# Patient Record
Sex: Female | Born: 1965 | Race: White | Hispanic: No | Marital: Married | Smoking: Never smoker
Health system: Southern US, Community
[De-identification: ages and names within clinical notes are randomized; demographics above are authoritative.]

## PROBLEM LIST (undated history)

## (undated) DIAGNOSIS — D649 Anemia, unspecified: Secondary | ICD-10-CM

## (undated) DIAGNOSIS — K5790 Diverticulosis of intestine, part unspecified, without perforation or abscess without bleeding: Secondary | ICD-10-CM

## (undated) DIAGNOSIS — K279 Peptic ulcer, site unspecified, unspecified as acute or chronic, without hemorrhage or perforation: Secondary | ICD-10-CM

## (undated) DIAGNOSIS — N809 Endometriosis, unspecified: Secondary | ICD-10-CM

## (undated) DIAGNOSIS — K219 Gastro-esophageal reflux disease without esophagitis: Secondary | ICD-10-CM

## (undated) DIAGNOSIS — T4145XA Adverse effect of unspecified anesthetic, initial encounter: Secondary | ICD-10-CM

## (undated) DIAGNOSIS — T884XXA Failed or difficult intubation, initial encounter: Secondary | ICD-10-CM

## (undated) DIAGNOSIS — M459 Ankylosing spondylitis of unspecified sites in spine: Secondary | ICD-10-CM

## (undated) DIAGNOSIS — R768 Other specified abnormal immunological findings in serum: Secondary | ICD-10-CM

## (undated) DIAGNOSIS — J189 Pneumonia, unspecified organism: Secondary | ICD-10-CM

## (undated) DIAGNOSIS — M51369 Other intervertebral disc degeneration, lumbar region without mention of lumbar back pain or lower extremity pain: Secondary | ICD-10-CM

## (undated) DIAGNOSIS — T8859XA Other complications of anesthesia, initial encounter: Secondary | ICD-10-CM

## (undated) DIAGNOSIS — F909 Attention-deficit hyperactivity disorder, unspecified type: Secondary | ICD-10-CM

## (undated) DIAGNOSIS — R1013 Epigastric pain: Secondary | ICD-10-CM

## (undated) DIAGNOSIS — M150 Primary generalized (osteo)arthritis: Secondary | ICD-10-CM

## (undated) HISTORY — PX: MICRODISCECTOMY LUMBAR: SUR864

## (undated) HISTORY — PX: DILATION AND CURETTAGE OF UTERUS: SHX78

## (undated) HISTORY — PX: ABDOMINAL HYSTERECTOMY: SHX81

## (undated) HISTORY — DX: Endometriosis, unspecified: N80.9

## (undated) HISTORY — PX: COLONOSCOPY: SHX174

## (undated) HISTORY — DX: Gastro-esophageal reflux disease without esophagitis: K21.9

## (undated) HISTORY — PX: BREAST BIOPSY: SHX20

## (undated) HISTORY — DX: Diverticulosis of intestine, part unspecified, without perforation or abscess without bleeding: K57.90

---

## 1990-02-01 DIAGNOSIS — T884XXA Failed or difficult intubation, initial encounter: Secondary | ICD-10-CM

## 1990-02-01 HISTORY — DX: Failed or difficult intubation, initial encounter: T88.4XXA

## 1997-02-01 HISTORY — PX: CHOLECYSTECTOMY: SHX55

## 2004-06-20 ENCOUNTER — Other Ambulatory Visit: Payer: Self-pay

## 2004-06-20 ENCOUNTER — Emergency Department: Payer: Self-pay | Admitting: Emergency Medicine

## 2004-06-25 ENCOUNTER — Ambulatory Visit: Payer: Self-pay

## 2004-12-17 ENCOUNTER — Ambulatory Visit: Payer: Self-pay

## 2005-06-10 ENCOUNTER — Ambulatory Visit: Payer: Self-pay | Admitting: Internal Medicine

## 2006-01-03 ENCOUNTER — Ambulatory Visit: Payer: Self-pay | Admitting: Pain Medicine

## 2006-02-08 ENCOUNTER — Ambulatory Visit: Payer: Self-pay | Admitting: Internal Medicine

## 2006-02-18 ENCOUNTER — Ambulatory Visit: Payer: Self-pay | Admitting: Pain Medicine

## 2006-02-22 ENCOUNTER — Ambulatory Visit: Payer: Self-pay

## 2006-03-07 ENCOUNTER — Ambulatory Visit: Payer: Self-pay | Admitting: Physician Assistant

## 2006-09-02 ENCOUNTER — Ambulatory Visit: Payer: Self-pay | Admitting: Internal Medicine

## 2006-10-26 ENCOUNTER — Ambulatory Visit: Payer: Self-pay | Admitting: Unknown Physician Specialty

## 2006-12-19 ENCOUNTER — Ambulatory Visit: Payer: Self-pay

## 2007-01-17 ENCOUNTER — Ambulatory Visit: Payer: Self-pay | Admitting: Unknown Physician Specialty

## 2007-01-18 ENCOUNTER — Ambulatory Visit: Payer: Self-pay | Admitting: Unknown Physician Specialty

## 2007-01-23 ENCOUNTER — Emergency Department: Payer: Self-pay | Admitting: Emergency Medicine

## 2007-01-30 ENCOUNTER — Inpatient Hospital Stay: Payer: Self-pay | Admitting: Unknown Physician Specialty

## 2007-02-02 HISTORY — PX: BACK SURGERY: SHX140

## 2007-02-24 ENCOUNTER — Ambulatory Visit: Payer: Self-pay | Admitting: Internal Medicine

## 2007-03-14 ENCOUNTER — Encounter: Payer: Self-pay | Admitting: Physician Assistant

## 2007-04-02 ENCOUNTER — Encounter: Payer: Self-pay | Admitting: Physician Assistant

## 2007-04-19 ENCOUNTER — Ambulatory Visit: Payer: Self-pay

## 2007-05-03 ENCOUNTER — Encounter: Payer: Self-pay | Admitting: Physician Assistant

## 2007-06-02 ENCOUNTER — Encounter: Payer: Self-pay | Admitting: Physician Assistant

## 2007-07-11 ENCOUNTER — Ambulatory Visit: Payer: Self-pay | Admitting: Internal Medicine

## 2007-08-28 ENCOUNTER — Ambulatory Visit: Payer: Self-pay

## 2007-08-29 ENCOUNTER — Ambulatory Visit: Payer: Self-pay

## 2008-02-02 HISTORY — PX: LAPAROSCOPIC SUPRACERVICAL HYSTERECTOMY: SUR797

## 2008-05-30 ENCOUNTER — Ambulatory Visit: Payer: Self-pay

## 2008-10-02 ENCOUNTER — Ambulatory Visit: Payer: Self-pay

## 2008-10-10 ENCOUNTER — Inpatient Hospital Stay: Payer: Self-pay

## 2009-05-13 ENCOUNTER — Ambulatory Visit: Payer: Self-pay | Admitting: Internal Medicine

## 2009-08-06 ENCOUNTER — Ambulatory Visit: Payer: Self-pay

## 2009-08-15 ENCOUNTER — Ambulatory Visit: Payer: Self-pay | Admitting: Psychiatry

## 2010-09-30 ENCOUNTER — Ambulatory Visit: Payer: Self-pay

## 2010-11-18 ENCOUNTER — Other Ambulatory Visit: Payer: Self-pay | Admitting: Psychiatry

## 2011-09-14 ENCOUNTER — Other Ambulatory Visit: Payer: Self-pay | Admitting: Psychiatry

## 2011-09-14 LAB — CBC WITH DIFFERENTIAL/PLATELET
Basophil #: 0 10*3/uL (ref 0.0–0.1)
Basophil %: 0.7 %
Eosinophil #: 0.1 10*3/uL (ref 0.0–0.7)
Lymphocyte #: 1.8 10*3/uL (ref 1.0–3.6)
Lymphocyte %: 34.9 %
MCV: 89 fL (ref 80–100)
Monocyte #: 0.3 x10 3/mm (ref 0.2–0.9)
Monocyte %: 6.3 %
Neutrophil #: 3 10*3/uL (ref 1.4–6.5)
Neutrophil %: 56.4 %
Platelet: 247 10*3/uL (ref 150–440)
RBC: 3.81 10*6/uL (ref 3.80–5.20)
RDW: 12.8 % (ref 11.5–14.5)
WBC: 5.3 10*3/uL (ref 3.6–11.0)

## 2011-09-14 LAB — POTASSIUM: Potassium: 4.4 mmol/L (ref 3.5–5.1)

## 2011-09-14 LAB — CREATININE, SERUM
EGFR (African American): >60
EGFR (Non-African Amer.): >60

## 2011-09-14 LAB — SGOT (AST)(ARMC): SGOT(AST): 14 U/L — ABNORMAL LOW (ref 15–37)

## 2011-09-14 LAB — GLUCOSE, RANDOM: Glucose: 110 mg/dL — ABNORMAL HIGH (ref 65–99)

## 2011-09-14 LAB — SODIUM: Sodium: 140 mmol/L (ref 136–145)

## 2011-09-14 LAB — IRON: Iron: 82 ug/dL (ref 50–170)

## 2011-09-14 LAB — ALT: SGPT (ALT): 14 U/L (ref 12–78)

## 2011-10-07 ENCOUNTER — Ambulatory Visit: Payer: Self-pay

## 2012-10-10 ENCOUNTER — Ambulatory Visit: Payer: Self-pay

## 2012-10-20 ENCOUNTER — Ambulatory Visit: Payer: Self-pay

## 2012-11-23 ENCOUNTER — Ambulatory Visit: Payer: Self-pay | Admitting: Physical Medicine and Rehabilitation

## 2013-10-16 ENCOUNTER — Ambulatory Visit: Payer: Self-pay | Admitting: Internal Medicine

## 2013-10-19 ENCOUNTER — Ambulatory Visit: Payer: Self-pay

## 2013-11-13 ENCOUNTER — Other Ambulatory Visit: Payer: Self-pay | Admitting: Surgery

## 2013-11-13 DIAGNOSIS — N6489 Other specified disorders of breast: Secondary | ICD-10-CM

## 2013-11-21 ENCOUNTER — Ambulatory Visit
Admission: RE | Admit: 2013-11-21 | Discharge: 2013-11-21 | Disposition: A | Discharge status: Home / Self Care | Payer: 59 | Source: Ambulatory Visit | Attending: Surgery | Admitting: Surgery

## 2013-11-21 DIAGNOSIS — N6489 Other specified disorders of breast: Secondary | ICD-10-CM

## 2013-11-21 MED ORDER — GADOBENATE DIMEGLUMINE 529 MG/ML IV SOLN
14.0000 mL | Freq: Once | INTRAVENOUS | Status: AC | PRN
  Administered 2013-11-21: 14 mL via INTRAVENOUS

## 2013-11-23 ENCOUNTER — Ambulatory Visit: Payer: Self-pay | Admitting: Unknown Physician Specialty

## 2014-03-19 ENCOUNTER — Emergency Department: Payer: Self-pay | Admitting: Internal Medicine

## 2014-05-27 LAB — SURGICAL PATHOLOGY

## 2014-11-05 ENCOUNTER — Other Ambulatory Visit: Payer: Self-pay | Admitting: Unknown Physician Specialty

## 2014-11-05 DIAGNOSIS — R922 Inconclusive mammogram: Secondary | ICD-10-CM

## 2014-11-13 ENCOUNTER — Ambulatory Visit: Payer: 59

## 2014-11-13 ENCOUNTER — Other Ambulatory Visit: Payer: 59

## 2014-11-14 ENCOUNTER — Ambulatory Visit
Admission: RE | Admit: 2014-11-14 | Discharge: 2014-11-14 | Disposition: A | Discharge status: Home / Self Care | Payer: 59 | Source: Ambulatory Visit | Attending: Unknown Physician Specialty | Admitting: Unknown Physician Specialty

## 2014-11-14 DIAGNOSIS — R922 Inconclusive mammogram: Secondary | ICD-10-CM

## 2015-05-09 DIAGNOSIS — D6489 Other specified anemias: Secondary | ICD-10-CM | POA: Diagnosis not present

## 2015-05-09 DIAGNOSIS — Z0000 Encounter for general adult medical examination without abnormal findings: Secondary | ICD-10-CM | POA: Diagnosis not present

## 2015-05-27 DIAGNOSIS — D6489 Other specified anemias: Secondary | ICD-10-CM | POA: Diagnosis not present

## 2015-05-27 DIAGNOSIS — J3089 Other allergic rhinitis: Secondary | ICD-10-CM | POA: Diagnosis not present

## 2015-05-27 DIAGNOSIS — R1013 Epigastric pain: Secondary | ICD-10-CM | POA: Diagnosis not present

## 2015-05-27 DIAGNOSIS — M5136 Other intervertebral disc degeneration, lumbar region: Secondary | ICD-10-CM | POA: Diagnosis not present

## 2015-06-03 DIAGNOSIS — R609 Edema, unspecified: Secondary | ICD-10-CM | POA: Diagnosis not present

## 2015-06-03 DIAGNOSIS — R079 Chest pain, unspecified: Secondary | ICD-10-CM | POA: Diagnosis not present

## 2015-06-26 DIAGNOSIS — M5136 Other intervertebral disc degeneration, lumbar region: Secondary | ICD-10-CM | POA: Diagnosis not present

## 2015-06-26 DIAGNOSIS — D6489 Other specified anemias: Secondary | ICD-10-CM | POA: Diagnosis not present

## 2015-06-26 DIAGNOSIS — J3089 Other allergic rhinitis: Secondary | ICD-10-CM | POA: Diagnosis not present

## 2015-06-26 DIAGNOSIS — F4321 Adjustment disorder with depressed mood: Secondary | ICD-10-CM | POA: Diagnosis not present

## 2015-07-14 ENCOUNTER — Encounter: Payer: Self-pay | Admitting: Emergency Medicine

## 2015-07-14 ENCOUNTER — Emergency Department: Payer: 59

## 2015-07-14 ENCOUNTER — Emergency Department
Admission: EM | Admit: 2015-07-14 | Discharge: 2015-07-14 | Disposition: A | Discharge status: Home / Self Care | Payer: 59 | Attending: Emergency Medicine | Admitting: Emergency Medicine

## 2015-07-14 DIAGNOSIS — R1031 Right lower quadrant pain: Secondary | ICD-10-CM | POA: Diagnosis present

## 2015-07-14 DIAGNOSIS — R109 Unspecified abdominal pain: Secondary | ICD-10-CM | POA: Diagnosis not present

## 2015-07-14 DIAGNOSIS — N390 Urinary tract infection, site not specified: Secondary | ICD-10-CM | POA: Diagnosis not present

## 2015-07-14 LAB — CBC
HEMATOCRIT: 35.3 % (ref 35.0–47.0)
HEMOGLOBIN: 11.9 g/dL — AB (ref 12.0–16.0)
MCH: 27.9 pg (ref 26.0–34.0)
MCHC: 33.7 g/dL (ref 32.0–36.0)
MCV: 82.7 fL (ref 80.0–100.0)
Platelets: 321 10*3/uL (ref 150–440)
RBC: 4.27 MIL/uL (ref 3.80–5.20)
RDW: 13.3 % (ref 11.5–14.5)
WBC: 8.3 10*3/uL (ref 3.6–11.0)

## 2015-07-14 LAB — BASIC METABOLIC PANEL
Anion gap: 11 (ref 5–15)
BUN: 7 mg/dL (ref 6–20)
CALCIUM: 9.4 mg/dL (ref 8.9–10.3)
CO2: 31 mmol/L (ref 22–32)
CREATININE: 0.68 mg/dL (ref 0.44–1.00)
Chloride: 89 mmol/L — ABNORMAL LOW (ref 101–111)
GFR calc non Af Amer: >60 mL/min (ref >60)
GLUCOSE: 100 mg/dL — AB (ref 65–99)
Potassium: 3.1 mmol/L — ABNORMAL LOW (ref 3.5–5.1)
Sodium: 131 mmol/L — ABNORMAL LOW (ref 135–145)

## 2015-07-14 LAB — URINALYSIS COMPLETE WITH MICROSCOPIC (ARMC ONLY)
BILIRUBIN URINE: NEGATIVE
Glucose, UA: NEGATIVE mg/dL
HGB URINE DIPSTICK: NEGATIVE
Ketones, ur: NEGATIVE mg/dL
NITRITE: NEGATIVE
PH: 7 (ref 5.0–8.0)
Protein, ur: NEGATIVE mg/dL
Specific Gravity, Urine: 1.005 (ref 1.005–1.030)

## 2015-07-14 LAB — LIPASE, BLOOD: LIPASE: 25 U/L (ref 11–51)

## 2015-07-14 LAB — COMPREHENSIVE METABOLIC PANEL
ALT: 14 U/L (ref 14–54)
ANION GAP: 11 (ref 5–15)
AST: 20 U/L (ref 15–41)
Albumin: 4.4 g/dL (ref 3.5–5.0)
Alkaline Phosphatase: 89 U/L (ref 38–126)
BILIRUBIN TOTAL: 0.8 mg/dL (ref 0.3–1.2)
BUN: 7 mg/dL (ref 6–20)
CO2: 28 mmol/L (ref 22–32)
Calcium: 9.3 mg/dL (ref 8.9–10.3)
Chloride: 90 mmol/L — ABNORMAL LOW (ref 101–111)
Creatinine, Ser: 0.64 mg/dL (ref 0.44–1.00)
Glucose, Bld: 95 mg/dL (ref 65–99)
POTASSIUM: 2.7 mmol/L — AB (ref 3.5–5.1)
Sodium: 129 mmol/L — ABNORMAL LOW (ref 135–145)
TOTAL PROTEIN: 8 g/dL (ref 6.5–8.1)

## 2015-07-14 MED ORDER — LEVOFLOXACIN 500 MG PO TABS: 500.0000 mg | ORAL_TABLET | Freq: Every day | ORAL | Status: AC

## 2015-07-14 MED ORDER — LEVOFLOXACIN 500 MG PO TABS
500.0000 mg | ORAL_TABLET | Freq: Once | ORAL | Status: AC
  Administered 2015-07-14: 500 mg via ORAL
  Filled 2015-07-14: qty 1

## 2015-07-14 MED ORDER — POTASSIUM CHLORIDE CRYS ER 20 MEQ PO TBCR: 20.0000 meq | EXTENDED_RELEASE_TABLET | Freq: Every day | ORAL | Status: DC

## 2015-07-14 MED ORDER — DOCUSATE SODIUM 100 MG PO CAPS: 100.0000 mg | ORAL_CAPSULE | Freq: Two times a day (BID) | ORAL | Status: DC

## 2015-07-14 MED ORDER — TRAMADOL HCL 50 MG PO TABS: 50.0000 mg | ORAL_TABLET | Freq: Four times a day (QID) | ORAL | Status: DC | PRN

## 2015-07-14 NOTE — ED Notes (Signed)
Pt wheeled from CT to room via wheelchair.

## 2015-07-14 NOTE — ED Notes (Signed)
Pt reports Friday upper abdomen pain, now lower R abdomen pain. Pt also reports back pain. Took Ducolax Friday and Miralax Saturday. Saturday had some diarrhea, Sunday passed small BM. No hx of kidney stones, does not have gallbladder.

## 2015-07-14 NOTE — ED Notes (Addendum)
C/o right flank, RLQ and back pain.  Pain started on Friday, but pain was in RUQ.  Patient states bowel movements have decreased as well.  Patient has taken miralax and dulcolax over the weekend.  Describes pain as lessening.

## 2015-07-14 NOTE — ED Provider Notes (Signed)
[facility] Emergency Department Provider Note  Time seen: 9:20 PM  I have reviewed the triage vital signs and the nursing notes.   HISTORY  Chief Complaint Flank Pain and Back Pain    HPI Christalyn Stantz is a 50 y.o. female no past medical history presents the emergency department with right flank pain. According to the patient since Friday she has been experiencing right-sided abdominal pain. States he was much worse on Friday, states she felt constipated took Dulcolax and MiraLAX and had some bowel movement Saturday and Sunday. States the pain continued today but not as severe. Patient states she is status post cholecystectomy, states no history of kidney stones. Denies any dysuria but has noticed decreased urination. Denies any fever. States nausea on Friday but denies any today.Currently describes the pain as a 3/10 dull pain in the right mid abdomen. Patient has not noted any modifying factors.     History reviewed. No pertinent past medical history.  There are no active problems to display for this patient.   History reviewed. No pertinent past surgical history.  No current outpatient prescriptions on file.  Allergies Norflex  Family History  Problem Relation Age of Onset  . Breast cancer Paternal Aunt 102    Social History Social History  Substance Use Topics  . Smoking status: Never Smoker   . Smokeless tobacco: None  . Alcohol Use: None    Review of Systems Constitutional: Negative for fever. Cardiovascular: Negative for chest pain. Respiratory: Negative for shortness of breath. Gastrointestinal: Right flank pain. Positive for nausea at times. Negative for vomiting or diarrhea. Positive for constipation. Genitourinary: Negative for dysuria. Decreased urine production. Musculoskeletal: Negative for back pain. Neurological: Negative for headache 10-point ROS otherwise  negative.  ____________________________________________   PHYSICAL EXAM:  VITAL SIGNS: ED Triage Vitals  Enc Vitals Group     BP 07/14/15 1925 122/69 mmHg     Pulse Rate 07/14/15 1925 84     Resp 07/14/15 1925 18     Temp 07/14/15 1925 98.1 F (36.7 C)     Temp Source 07/14/15 1925 Oral     SpO2 07/14/15 1925 98 %     Weight 07/14/15 1925 147 lb (66.679 kg)     Height 07/14/15 1925 5' (1.524 m)     Head Cir --      Peak Flow --      Pain Score 07/14/15 1926 4     Pain Loc --      Pain Edu? --      Excl. in [place]? --     Constitutional: Alert and oriented. Well appearing and in no distress. Eyes: Normal exam ENT   Head: Normocephalic and atraumatic   Mouth/Throat: Mucous membranes are moist. Cardiovascular: Normal rate, regular rhythm. No murmur Respiratory: Normal respiratory effort without tachypnea nor retractions. Breath sounds are clear  Gastrointestinal: Soft, mild right mid abdomen tenderness palpation. No rebound or guarding. No distention. No CVA tenderness. Musculoskeletal: Nontender with normal range of motion in all extremities. Neurologic:  Normal speech and language. No gross focal neurologic deficits  Skin:  Skin is warm, dry and intact.  Psychiatric: Mood and affect are normal.   ____________________________________________   RADIOLOGY  CT is largely normal. Pulmonary nodule, but patient is a very low risk.  ____________________________________________    INITIAL IMPRESSION / ASSESSMENT AND PLAN / ED COURSE  Pertinent labs & imaging results that were available during my care of the patient were reviewed by me  and considered in my medical decision making (see chart for details).  Overall the patient appears very well, mild right mid abdominal tenderness palpation. Patient's labs are significant for hypokalemia as well as hyponatremia. Urinalysis shows many bacteria. Could possibly be a sign of pyelonephritis. Given the patient's hypokalemia  with hyponatremia we'll recheck the patient's chemistry Marcello Moores he does not have a history of either. We'll obtain a CT renal scan to evaluate her right flank pain. Overall the patient appears well, states she does not wish for any pain medication currently a 3/10. States she had nausea several days ago but denies any currently, does not show any nausea medication.  Repeat chemistry shows hyponatremia but not to the same degree, 131 on repeat testing. Hypokalemia 3.1. I discussed this with the patient she states her doctor recently started her on a fluid pill, this would likely explain the hypokalemia. We'll place patient on 20 mg of Klor-Con daily, have her follow up with a primary care doctor in 3-4 days for repeat testing. I discussed strict return precautions for the patient, she is agreeable to this plan. We'll discharge with a course of Colace, Levaquin, and Ultram as needed for discomfort.  ____________________________________________   FINAL CLINICAL IMPRESSION(S) / ED DIAGNOSES  Right flank pain Urinary tract infection  Harvest Dark, MD 07/14/15 2228

## 2015-07-14 NOTE — ED Notes (Signed)
Pt taken to CT via wheelchair.

## 2015-07-14 NOTE — ED Notes (Signed)
Pt ambulatory to restroom and back with steady gait noted.

## 2015-07-14 NOTE — Discharge Instructions (Signed)

## 2015-07-22 DIAGNOSIS — E876 Hypokalemia: Secondary | ICD-10-CM | POA: Diagnosis not present

## 2015-07-22 DIAGNOSIS — R600 Localized edema: Secondary | ICD-10-CM | POA: Diagnosis not present

## 2015-07-22 DIAGNOSIS — E871 Hypo-osmolality and hyponatremia: Secondary | ICD-10-CM | POA: Diagnosis not present

## 2015-07-22 DIAGNOSIS — N390 Urinary tract infection, site not specified: Secondary | ICD-10-CM | POA: Diagnosis not present

## 2015-08-06 DIAGNOSIS — R1013 Epigastric pain: Secondary | ICD-10-CM | POA: Diagnosis not present

## 2015-08-06 DIAGNOSIS — R600 Localized edema: Secondary | ICD-10-CM | POA: Diagnosis not present

## 2015-08-06 DIAGNOSIS — M5136 Other intervertebral disc degeneration, lumbar region: Secondary | ICD-10-CM | POA: Diagnosis not present

## 2015-08-06 DIAGNOSIS — D6489 Other specified anemias: Secondary | ICD-10-CM | POA: Diagnosis not present

## 2015-08-09 ENCOUNTER — Telehealth: Payer: 59 | Admitting: Family

## 2015-08-09 DIAGNOSIS — J029 Acute pharyngitis, unspecified: Secondary | ICD-10-CM | POA: Diagnosis not present

## 2015-08-09 MED ORDER — BENZONATATE 100 MG PO CAPS: 100.0000 mg | ORAL_CAPSULE | Freq: Three times a day (TID) | ORAL | Status: DC | PRN

## 2015-08-09 MED ORDER — AZITHROMYCIN 250 MG PO TABS: ORAL_TABLET | ORAL | Status: DC

## 2015-08-09 NOTE — Progress Notes (Signed)

## 2015-09-25 ENCOUNTER — Other Ambulatory Visit: Payer: Self-pay | Admitting: Internal Medicine

## 2015-09-25 DIAGNOSIS — Z1231 Encounter for screening mammogram for malignant neoplasm of breast: Secondary | ICD-10-CM

## 2015-10-28 ENCOUNTER — Encounter: Payer: Self-pay | Admitting: General Surgery

## 2015-10-28 DIAGNOSIS — L0291 Cutaneous abscess, unspecified: Secondary | ICD-10-CM | POA: Diagnosis not present

## 2015-10-28 DIAGNOSIS — R203 Hyperesthesia: Secondary | ICD-10-CM | POA: Diagnosis not present

## 2015-10-28 DIAGNOSIS — L728 Other follicular cysts of the skin and subcutaneous tissue: Secondary | ICD-10-CM | POA: Diagnosis not present

## 2015-10-30 DIAGNOSIS — H5213 Myopia, bilateral: Secondary | ICD-10-CM | POA: Diagnosis not present

## 2015-11-10 DIAGNOSIS — Z1211 Encounter for screening for malignant neoplasm of colon: Secondary | ICD-10-CM | POA: Diagnosis not present

## 2015-11-10 DIAGNOSIS — Z01419 Encounter for gynecological examination (general) (routine) without abnormal findings: Secondary | ICD-10-CM | POA: Diagnosis not present

## 2015-11-10 DIAGNOSIS — Z0000 Encounter for general adult medical examination without abnormal findings: Secondary | ICD-10-CM | POA: Diagnosis not present

## 2015-11-11 ENCOUNTER — Ambulatory Visit: Payer: Self-pay | Admitting: General Surgery

## 2015-11-13 ENCOUNTER — Encounter: Payer: Self-pay | Admitting: General Surgery

## 2015-11-13 ENCOUNTER — Ambulatory Visit (INDEPENDENT_AMBULATORY_CARE_PROVIDER_SITE_OTHER): Payer: 59 | Admitting: General Surgery

## 2015-11-13 VITALS — BP 122/78 | HR 76 | Resp 12 | Ht 60.0 in | Wt 150.0 lb

## 2015-11-13 DIAGNOSIS — R1909 Other intra-abdominal and pelvic swelling, mass and lump: Secondary | ICD-10-CM | POA: Diagnosis not present

## 2015-11-13 DIAGNOSIS — M7989 Other specified soft tissue disorders: Secondary | ICD-10-CM | POA: Diagnosis not present

## 2015-11-13 NOTE — Progress Notes (Signed)
Patient ID: Paige Robles, female   DOB: 1965-11-11, 50 y.o.   MRN: [id number]  Chief Complaint  Patient presents with  . Other    HPI Paige Robles is a 50 y.o. female here today for a evaluation of an left inguinal cyst.The area was I&D on 10/28/15. Patient states she has had this area off and on for years. This is the worst time this area was inflammed.  HPI  Past Medical History:  Diagnosis Date  . GERD (gastroesophageal reflux disease)     Past Surgical History:  Procedure Laterality Date  . ABDOMINAL HYSTERECTOMY  03/2008  . BACK SURGERY  2009  . CESAREAN SECTION  [id number]  . CHOLECYSTECTOMY  1999  . DILATION AND CURETTAGE OF UTERUS  [phone]    Family History  Problem Relation Age of Onset  . Breast cancer Paternal Aunt 9    Social History Social History  Substance Use Topics  . Smoking status: Never Smoker  . Smokeless tobacco: Never Used  . Alcohol use No    Allergies  Allergen Reactions  . Norflex [Orphenadrine Citrate] Other (See Comments)    Seizure     Current Outpatient Prescriptions  Medication Sig Dispense Refill  . azelastine (ASTELIN) 0.1 % nasal spray Place into both nostrils 2 (two) times daily. Use in each nostril as directed    . DORYX MPC 120 MG TBEC Take 1 tablet by mouth 2 (two) times daily.  1  . DULoxetine (CYMBALTA) 60 MG capsule Take 60 mg by mouth daily.     Marland Kitchen esomeprazole (NEXIUM) 40 MG capsule Take 40 mg by mouth daily at 12 noon.     . fexofenadine (ALLEGRA) 180 MG tablet Take 180 mg by mouth daily.     . hydrochlorothiazide (HYDRODIURIL) 25 MG tablet Take 25 mg by mouth daily.     Marland Kitchen LORazepam (ATIVAN) 0.5 MG tablet Take by mouth.    . metaxalone (SKELAXIN) 800 MG tablet Take 800 mg by mouth 3 (three) times daily.     . mometasone (NASONEX) 50 MCG/ACT nasal spray Place 2 sprays into the nose as needed.     No current facility-administered medications for this visit.     Review of Systems Review of  Systems  Blood pressure 122/78, pulse 76, resp. rate 12, height 5' (1.524 m), weight 150 lb (68 kg).  Physical Exam Physical Exam  Constitutional: She is oriented to person, place, and time. She appears well-developed and well-nourished.  Abdominal: Soft. Normal appearance.    Neurological: She is alert and oriented to person, place, and time.  Skin: Skin is warm and dry.    Data Reviewed Dermatology notes of 10/28/2015.  Assessment    Sebaceous cyst with recent inflammation and drainage.    Plan    The patient has had several episodes were incision and drainage is been required. Considering the acute inflammatory process is resolved excision may be her best option. She was amenable to proceed. The area was cleansed with ChloraPrep and 10 mL of 0.5% Xylocaine with 0.25% Marcaine with 1-200,000 of epinephrine was utilized well tolerated. The area was excised through elliptical incision including the original range sinus. Deep tissues is approximated with interrupted 3-0 Vicryl figure-of-eight sutures. The skin was closed with a running 4-0 Vicryl subcuticular suture. Benzoin, Steri-Strips, Telfa and taken dressing applied. There was removed from the surgical site with clippers prior to the procedure.  Considering the complete pleat resolution of the laboratory process the patient was asked  to continue her present anabiotic for 48 hours and then to discontinue them.  She may return for nursing wound check in one week if desired. Follow up otherwise will be on an as-needed basis.     This information has been scribed by Gaspar Cola [name].   Robert Bellow 11/13/2015, 9:32 PM

## 2015-11-17 ENCOUNTER — Telehealth: Payer: Self-pay | Admitting: *Deleted

## 2015-11-17 NOTE — Telephone Encounter (Signed)
Called and spoke with patient, she is aware of her pathology results.

## 2015-11-19 ENCOUNTER — Other Ambulatory Visit: Payer: Self-pay | Admitting: Internal Medicine

## 2015-11-19 ENCOUNTER — Ambulatory Visit
Admission: RE | Admit: 2015-11-19 | Discharge: 2015-11-19 | Disposition: A | Discharge status: Home / Self Care | Payer: 59 | Source: Ambulatory Visit | Attending: Internal Medicine | Admitting: Internal Medicine

## 2015-11-19 DIAGNOSIS — Z1231 Encounter for screening mammogram for malignant neoplasm of breast: Secondary | ICD-10-CM | POA: Insufficient documentation

## 2015-12-09 ENCOUNTER — Ambulatory Visit (INDEPENDENT_AMBULATORY_CARE_PROVIDER_SITE_OTHER): Payer: 59 | Admitting: *Deleted

## 2015-12-09 DIAGNOSIS — R1909 Other intra-abdominal and pelvic swelling, mass and lump: Secondary | ICD-10-CM

## 2015-12-09 NOTE — Patient Instructions (Signed)
The patient is aware to call back for any questions or concerns.  

## 2015-12-09 NOTE — Progress Notes (Signed)
Patient ID: Paige Robles, female   DOB: May 14, 1965, 50 y.o.   MRN: [id number] Patient came in today for a wound check, left groin.  The wound is clean, with no signs of infection noted. Minimal to no redness and and one tiny knot. She states it became tender 12-04-15 with vacation weekend. She had an antibiotic on hand Doryx that she started taking. Recommend a different panty to reduce irritation and heat or ice for comfort. She does think her weight (gain) has something to do with the irritation as well.Tylenol as needed. Follow up as scheduled, appointment made, pt agrees with plan.

## 2015-12-16 DIAGNOSIS — D6489 Other specified anemias: Secondary | ICD-10-CM | POA: Diagnosis not present

## 2015-12-16 DIAGNOSIS — R600 Localized edema: Secondary | ICD-10-CM | POA: Diagnosis not present

## 2015-12-17 ENCOUNTER — Ambulatory Visit (INDEPENDENT_AMBULATORY_CARE_PROVIDER_SITE_OTHER): Payer: 59 | Admitting: General Surgery

## 2015-12-17 VITALS — BP 116/72 | HR 88 | Resp 16 | Ht 60.0 in | Wt 153.0 lb

## 2015-12-17 DIAGNOSIS — R1909 Other intra-abdominal and pelvic swelling, mass and lump: Secondary | ICD-10-CM | POA: Diagnosis not present

## 2015-12-17 NOTE — Progress Notes (Signed)
Patient ID: Paige Robles, female   DOB: 1966-01-15, 50 y.o.   MRN: [id number]  Chief Complaint  Patient presents with  . Follow-up    HPI Paige Robles is a 50 y.o. female.  Here today for follow up left groin cyst. She states it is itching. She did a lot of walking this weekend and it started draining some. She has a bulging disc and is currently on prednisone taper.  HPI  Past Medical History:  Diagnosis Date  . GERD (gastroesophageal reflux disease)     Past Surgical History:  Procedure Laterality Date  . ABDOMINAL HYSTERECTOMY  03/2008  . BACK SURGERY  2009  . CESAREAN SECTION  [id number]  . CHOLECYSTECTOMY  1999  . DILATION AND CURETTAGE OF UTERUS  [phone]    Family History  Problem Relation Age of Onset  . Breast cancer Paternal Aunt 36    Social History Social History  Substance Use Topics  . Smoking status: Never Smoker  . Smokeless tobacco: Never Used  . Alcohol use No    Allergies  Allergen Reactions  . Norflex [Orphenadrine Citrate] Other (See Comments)    Seizure     Current Outpatient Prescriptions  Medication Sig Dispense Refill  . azelastine (ASTELIN) 0.1 % nasal spray Place into both nostrils 2 (two) times daily. Use in each nostril as directed    . DORYX MPC 120 MG TBEC Take 1 tablet by mouth 2 (two) times daily.  1  . DULoxetine (CYMBALTA) 60 MG capsule Take 60 mg by mouth daily.     Marland Kitchen esomeprazole (NEXIUM) 40 MG capsule Take 40 mg by mouth daily at 12 noon.     . fexofenadine (ALLEGRA) 180 MG tablet Take 180 mg by mouth daily.     . hydrochlorothiazide (HYDRODIURIL) 25 MG tablet Take 25 mg by mouth daily.     Marland Kitchen LORazepam (ATIVAN) 0.5 MG tablet Take by mouth.    . metaxalone (SKELAXIN) 800 MG tablet Take 800 mg by mouth 3 (three) times daily.     . mometasone (NASONEX) 50 MCG/ACT nasal spray Place 2 sprays into the nose as needed.    . predniSONE (DELTASONE) 10 MG tablet Take 4 tabs daily for 2 days, then 3 tabs daily for 2  days, then 2 tabs daily for 2 days, then 1 tab daily for 2 days     No current facility-administered medications for this visit.     Review of Systems Review of Systems  Constitutional: Negative.   Respiratory: Negative.   Cardiovascular: Negative.     Blood pressure 116/72, pulse 88, resp. rate 16, height 5' (1.524 m), weight 153 lb (69.4 kg), SpO2 97 %.  Physical Exam Physical Exam  Constitutional: She is oriented to person, place, and time. She appears well-developed and well-nourished.  Abdominal:    Genitourinary:  Genitourinary Comments: Thickening left groin incision area  Neurological: She is alert and oriented to person, place, and time.  Skin: Skin is warm and dry.    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  Assessment    Persistent drainage from the lateral wound status post resection of chronic inflammatory tissue.    Plan    The patient was amenable to incision and drainage. 3 mL of 0.5% Xylocaine with 0.25% Marcaine with 1-200,000 epinephrine was utilized well tolerated. ChloraPrep was applied to the skin. The area was incised and a 3 mm pellet of firm tissue extracted  with a scant amount of fluid. This was discarded and the cavity treated with silver nitrate. Dry dressing applied.  I anticipate this area will now resolved after removal of the small foreign body, likely related to the previous subcuticular suture.  The patient will call if the area has not resolved by the end of this month.      This information has been scribed by Karie Fetch RN, BSN,BC.  Robert Bellow 12/17/2015, 11:13 PM

## 2015-12-17 NOTE — Patient Instructions (Signed)
The patient is aware to call back for any questions or concerns.  

## 2015-12-23 DIAGNOSIS — F4321 Adjustment disorder with depressed mood: Secondary | ICD-10-CM | POA: Diagnosis not present

## 2015-12-23 DIAGNOSIS — R600 Localized edema: Secondary | ICD-10-CM | POA: Diagnosis not present

## 2015-12-23 DIAGNOSIS — D649 Anemia, unspecified: Secondary | ICD-10-CM | POA: Diagnosis not present

## 2015-12-23 DIAGNOSIS — M5136 Other intervertebral disc degeneration, lumbar region: Secondary | ICD-10-CM | POA: Diagnosis not present

## 2015-12-31 DIAGNOSIS — D485 Neoplasm of uncertain behavior of skin: Secondary | ICD-10-CM | POA: Diagnosis not present

## 2015-12-31 DIAGNOSIS — Z1283 Encounter for screening for malignant neoplasm of skin: Secondary | ICD-10-CM | POA: Diagnosis not present

## 2015-12-31 DIAGNOSIS — L578 Other skin changes due to chronic exposure to nonionizing radiation: Secondary | ICD-10-CM | POA: Diagnosis not present

## 2015-12-31 DIAGNOSIS — L821 Other seborrheic keratosis: Secondary | ICD-10-CM | POA: Diagnosis not present

## 2015-12-31 DIAGNOSIS — D239 Other benign neoplasm of skin, unspecified: Secondary | ICD-10-CM

## 2015-12-31 DIAGNOSIS — D225 Melanocytic nevi of trunk: Secondary | ICD-10-CM | POA: Diagnosis not present

## 2015-12-31 DIAGNOSIS — L718 Other rosacea: Secondary | ICD-10-CM | POA: Diagnosis not present

## 2015-12-31 DIAGNOSIS — D1800 Hemangioma unspecified site: Secondary | ICD-10-CM | POA: Diagnosis not present

## 2015-12-31 DIAGNOSIS — D229 Melanocytic nevi, unspecified: Secondary | ICD-10-CM | POA: Diagnosis not present

## 2015-12-31 DIAGNOSIS — L812 Freckles: Secondary | ICD-10-CM | POA: Diagnosis not present

## 2015-12-31 DIAGNOSIS — L905 Scar conditions and fibrosis of skin: Secondary | ICD-10-CM | POA: Diagnosis not present

## 2015-12-31 HISTORY — DX: Other benign neoplasm of skin, unspecified: D23.9

## 2016-06-09 DIAGNOSIS — E871 Hypo-osmolality and hyponatremia: Secondary | ICD-10-CM | POA: Diagnosis not present

## 2016-06-09 DIAGNOSIS — Z0000 Encounter for general adult medical examination without abnormal findings: Secondary | ICD-10-CM | POA: Diagnosis not present

## 2016-06-09 DIAGNOSIS — D649 Anemia, unspecified: Secondary | ICD-10-CM | POA: Diagnosis not present

## 2016-06-16 DIAGNOSIS — Z0000 Encounter for general adult medical examination without abnormal findings: Secondary | ICD-10-CM | POA: Diagnosis not present

## 2016-06-16 DIAGNOSIS — R1013 Epigastric pain: Secondary | ICD-10-CM | POA: Diagnosis not present

## 2016-06-16 DIAGNOSIS — M5136 Other intervertebral disc degeneration, lumbar region: Secondary | ICD-10-CM | POA: Diagnosis not present

## 2016-06-16 DIAGNOSIS — R600 Localized edema: Secondary | ICD-10-CM | POA: Diagnosis not present

## 2016-07-05 DIAGNOSIS — D485 Neoplasm of uncertain behavior of skin: Secondary | ICD-10-CM | POA: Diagnosis not present

## 2016-07-05 DIAGNOSIS — L821 Other seborrheic keratosis: Secondary | ICD-10-CM | POA: Diagnosis not present

## 2016-07-05 DIAGNOSIS — L820 Inflamed seborrheic keratosis: Secondary | ICD-10-CM | POA: Diagnosis not present

## 2016-07-07 DIAGNOSIS — M222X2 Patellofemoral disorders, left knee: Secondary | ICD-10-CM | POA: Diagnosis not present

## 2016-07-07 DIAGNOSIS — M1712 Unilateral primary osteoarthritis, left knee: Secondary | ICD-10-CM | POA: Diagnosis not present

## 2016-07-07 DIAGNOSIS — M2242 Chondromalacia patellae, left knee: Secondary | ICD-10-CM | POA: Diagnosis not present

## 2016-09-24 DIAGNOSIS — M5136 Other intervertebral disc degeneration, lumbar region: Secondary | ICD-10-CM | POA: Diagnosis not present

## 2016-09-24 DIAGNOSIS — D649 Anemia, unspecified: Secondary | ICD-10-CM | POA: Diagnosis not present

## 2016-09-24 DIAGNOSIS — R1013 Epigastric pain: Secondary | ICD-10-CM | POA: Diagnosis not present

## 2016-09-24 DIAGNOSIS — K279 Peptic ulcer, site unspecified, unspecified as acute or chronic, without hemorrhage or perforation: Secondary | ICD-10-CM | POA: Diagnosis not present

## 2016-11-02 DIAGNOSIS — K219 Gastro-esophageal reflux disease without esophagitis: Secondary | ICD-10-CM | POA: Diagnosis not present

## 2016-11-02 DIAGNOSIS — R1013 Epigastric pain: Secondary | ICD-10-CM | POA: Diagnosis not present

## 2016-11-02 DIAGNOSIS — D649 Anemia, unspecified: Secondary | ICD-10-CM | POA: Diagnosis not present

## 2016-11-02 DIAGNOSIS — K625 Hemorrhage of anus and rectum: Secondary | ICD-10-CM | POA: Diagnosis not present

## 2016-11-10 ENCOUNTER — Encounter: Payer: Self-pay | Admitting: Obstetrics & Gynecology

## 2016-11-10 ENCOUNTER — Ambulatory Visit (INDEPENDENT_AMBULATORY_CARE_PROVIDER_SITE_OTHER): Payer: 59 | Admitting: Obstetrics & Gynecology

## 2016-11-10 VITALS — BP 110/70 | HR 87 | Ht 60.0 in | Wt 158.0 lb

## 2016-11-10 DIAGNOSIS — Z01419 Encounter for gynecological examination (general) (routine) without abnormal findings: Secondary | ICD-10-CM | POA: Diagnosis not present

## 2016-11-10 DIAGNOSIS — Z1211 Encounter for screening for malignant neoplasm of colon: Secondary | ICD-10-CM

## 2016-11-10 DIAGNOSIS — Z1231 Encounter for screening mammogram for malignant neoplasm of breast: Secondary | ICD-10-CM | POA: Diagnosis not present

## 2016-11-10 DIAGNOSIS — Z0000 Encounter for general adult medical examination without abnormal findings: Secondary | ICD-10-CM | POA: Insufficient documentation

## 2016-11-10 DIAGNOSIS — Z1239 Encounter for other screening for malignant neoplasm of breast: Secondary | ICD-10-CM

## 2016-11-10 DIAGNOSIS — Z1272 Encounter for screening for malignant neoplasm of vagina: Secondary | ICD-10-CM

## 2016-11-10 NOTE — Patient Instructions (Signed)
PAP Mammogram every year    Call [phone] to schedule at [facility] Colonoscopy every 5 years Labs yearly (with PCP)

## 2016-11-10 NOTE — Progress Notes (Signed)
HPI:      [name] is a 51 y.o. G3P0 who LMP was in the past, she presents today for her annual examination.  The patient has no complaints today. The patient is sexually active. Herlast pap: was normal and last mammogram: was normal.  The patient does perform self breast exams.  There is no notable family history of breast or ovarian cancer in her family. The patient is not taking hormone replacement therapy. Patient denies post-menopausal vaginal bleeding.   The patient has regular exercise: yes. The patient denies current symptoms of depression.    GYN Hx: Last Colonoscopy:3 years ago. Normal.  Last DEXA: never ago.    PMHx: Past Medical History:  Diagnosis Date  . GERD (gastroesophageal reflux disease)    Past Surgical History:  Procedure Laterality Date  . ABDOMINAL HYSTERECTOMY  03/2008  . BACK SURGERY  2009  . CESAREAN SECTION  [id number]  . CHOLECYSTECTOMY  1999  . DILATION AND CURETTAGE OF UTERUS  [phone]   Family History  Problem Relation Age of Onset  . Breast cancer Paternal Aunt 5   Social History  Substance Use Topics  . Smoking status: Never Smoker  . Smokeless tobacco: Never Used  . Alcohol use No    Current Outpatient Prescriptions:  .  azelastine (ASTELIN) 0.1 % nasal spray, Place into both nostrils 2 (two) times daily. Use in each nostril as directed, Disp: , Rfl:  .  DORYX MPC 120 MG TBEC, Take 1 tablet by mouth 2 (two) times daily., Disp: , Rfl: 1 .  DULoxetine (CYMBALTA) 60 MG capsule, Take 60 mg by mouth daily. , Disp: , Rfl:  .  esomeprazole (NEXIUM) 40 MG capsule, Take 40 mg by mouth daily at 12 noon. , Disp: , Rfl:  .  fexofenadine (ALLEGRA) 180 MG tablet, Take 180 mg by mouth daily. , Disp: , Rfl:  .  hydrochlorothiazide (HYDRODIURIL) 25 MG tablet, Take 25 mg by mouth daily. , Disp: , Rfl:  .  LORazepam (ATIVAN) 0.5 MG tablet, Take by mouth., Disp: , Rfl:  .  metaxalone (SKELAXIN) 800 MG tablet, Take 800 mg by mouth 3 (three)  times daily. , Disp: , Rfl:  .  mometasone (NASONEX) 50 MCG/ACT nasal spray, Place 2 sprays into the nose as needed., Disp: , Rfl:  Allergies: Norflex [orphenadrine citrate]  Review of Systems  Constitutional: Negative for chills, fever and malaise/fatigue.  HENT: Negative for congestion, sinus pain and sore throat.   Eyes: Negative for blurred vision and pain.  Respiratory: Negative for cough and wheezing.   Cardiovascular: Negative for chest pain and leg swelling.  Gastrointestinal: Negative for abdominal pain, constipation, diarrhea, heartburn, nausea and vomiting.  Genitourinary: Negative for dysuria, frequency, hematuria and urgency.  Musculoskeletal: Negative for back pain, joint pain, myalgias and neck pain.  Skin: Negative for itching and rash.  Neurological: Negative for dizziness, tremors and weakness.  Endo/Heme/Allergies: Does not bruise/bleed easily.  Psychiatric/Behavioral: Negative for depression. The patient is not nervous/anxious and does not have insomnia.     Objective: BP 110/70   Pulse 87   Ht 5' (1.524 m)   Wt 158 lb (71.7 kg)   BMI 30.86 kg/m   Filed Weights   11/10/16 1333  Weight: 158 lb (71.7 kg)   Body mass index is 30.86 kg/m. Physical Exam  Constitutional: She is oriented to person, place, and time. She appears well-developed and well-nourished. No distress.  Genitourinary: Rectum normal and vagina normal. Pelvic exam was  performed with patient supine. There is no rash or lesion on the right labia. There is no rash or lesion on the left labia. Vagina exhibits no lesion. No bleeding in the vagina. Right adnexum does not display mass and does not display tenderness. Left adnexum does not display mass and does not display tenderness.  Genitourinary Comments: Absent Uterus Absent cervix Vaginal cuff well healed  HENT:  Head: Normocephalic and atraumatic. Head is without laceration.  Right Ear: Hearing normal.  Left Ear: Hearing normal.  Nose: No  epistaxis.  No foreign bodies.  Mouth/Throat: Uvula is midline, oropharynx is clear and moist and mucous membranes are normal.  Eyes: Pupils are equal, round, and reactive to light.  Neck: Normal range of motion. Neck supple. No thyromegaly present.  Cardiovascular: Normal rate and regular rhythm.  Exam reveals no gallop and no friction rub.   No murmur heard. Pulmonary/Chest: Effort normal and breath sounds normal. No respiratory distress. She has no wheezes. Right breast exhibits no mass, no skin change and no tenderness. Left breast exhibits no mass, no skin change and no tenderness.  Abdominal: Soft. Bowel sounds are normal. She exhibits no distension. There is no tenderness. There is no rebound.  Musculoskeletal: Normal range of motion.  Neurological: She is alert and oriented to person, place, and time. No cranial nerve deficit.  Skin: Skin is warm and dry.  Psychiatric: She has a normal mood and affect. Judgment normal.  Vitals reviewed.  Assessment: Annual Exam 1. Annual physical exam   2. Screening for breast cancer   3. Screen for colon cancer   4. Screening for vaginal cancer    Plan:            1.  Cervical Screening-  Pap smear done today  2. Breast screening- Exam annually and mammogram scheduled  3. Colonoscopy every 5 years due to [place], Hemoccult testing after age 42  4. Labs managed by PCP, Dr Paige Robles  5. Counseling for hormonal therapy: none, no change in therapy today     F/U  Return in about 1 year (around 11/10/2017) for Annual.  Barnett Applebaum, MD, [facility] Robles Ob/G[facility], [facility] 11/10/2016  1:57 PM

## 2016-11-12 LAB — PAP IG (IMAGE GUIDED): PAP Smear Comment: 0

## 2016-12-02 ENCOUNTER — Ambulatory Visit
Admission: RE | Admit: 2016-12-02 | Discharge: 2016-12-02 | Disposition: A | Discharge status: Home / Self Care | Payer: 59 | Source: Ambulatory Visit | Attending: Obstetrics & Gynecology | Admitting: Obstetrics & Gynecology

## 2016-12-02 DIAGNOSIS — Z1231 Encounter for screening mammogram for malignant neoplasm of breast: Secondary | ICD-10-CM | POA: Diagnosis not present

## 2016-12-02 DIAGNOSIS — Z1239 Encounter for other screening for malignant neoplasm of breast: Secondary | ICD-10-CM

## 2016-12-14 DIAGNOSIS — K2970 Gastritis, unspecified, without bleeding: Secondary | ICD-10-CM | POA: Diagnosis not present

## 2016-12-14 DIAGNOSIS — K317 Polyp of stomach and duodenum: Secondary | ICD-10-CM | POA: Diagnosis not present

## 2016-12-14 DIAGNOSIS — K2960 Other gastritis without bleeding: Secondary | ICD-10-CM | POA: Diagnosis not present

## 2016-12-14 DIAGNOSIS — K3189 Other diseases of stomach and duodenum: Secondary | ICD-10-CM | POA: Diagnosis not present

## 2016-12-14 DIAGNOSIS — K219 Gastro-esophageal reflux disease without esophagitis: Secondary | ICD-10-CM | POA: Diagnosis not present

## 2016-12-14 DIAGNOSIS — K2950 Unspecified chronic gastritis without bleeding: Secondary | ICD-10-CM | POA: Diagnosis not present

## 2016-12-14 DIAGNOSIS — K210 Gastro-esophageal reflux disease with esophagitis: Secondary | ICD-10-CM | POA: Diagnosis not present

## 2016-12-14 DIAGNOSIS — R1013 Epigastric pain: Secondary | ICD-10-CM | POA: Diagnosis not present

## 2016-12-14 DIAGNOSIS — R12 Heartburn: Secondary | ICD-10-CM | POA: Diagnosis not present

## 2016-12-14 DIAGNOSIS — R857 Abnormal histological findings in specimens from digestive organs and abdominal cavity: Secondary | ICD-10-CM | POA: Diagnosis not present

## 2016-12-17 DIAGNOSIS — E7849 Other hyperlipidemia: Secondary | ICD-10-CM | POA: Diagnosis not present

## 2016-12-17 DIAGNOSIS — D649 Anemia, unspecified: Secondary | ICD-10-CM | POA: Diagnosis not present

## 2016-12-21 DIAGNOSIS — D649 Anemia, unspecified: Secondary | ICD-10-CM | POA: Diagnosis not present

## 2016-12-21 DIAGNOSIS — M5136 Other intervertebral disc degeneration, lumbar region: Secondary | ICD-10-CM | POA: Diagnosis not present

## 2016-12-21 DIAGNOSIS — K279 Peptic ulcer, site unspecified, unspecified as acute or chronic, without hemorrhage or perforation: Secondary | ICD-10-CM | POA: Diagnosis not present

## 2016-12-21 DIAGNOSIS — R600 Localized edema: Secondary | ICD-10-CM | POA: Diagnosis not present

## 2017-01-05 DIAGNOSIS — L821 Other seborrheic keratosis: Secondary | ICD-10-CM | POA: Diagnosis not present

## 2017-01-05 DIAGNOSIS — I788 Other diseases of capillaries: Secondary | ICD-10-CM | POA: Diagnosis not present

## 2017-01-05 DIAGNOSIS — D1800 Hemangioma unspecified site: Secondary | ICD-10-CM | POA: Diagnosis not present

## 2017-01-05 DIAGNOSIS — D485 Neoplasm of uncertain behavior of skin: Secondary | ICD-10-CM | POA: Diagnosis not present

## 2017-01-05 DIAGNOSIS — L812 Freckles: Secondary | ICD-10-CM | POA: Diagnosis not present

## 2017-01-05 DIAGNOSIS — Z1283 Encounter for screening for malignant neoplasm of skin: Secondary | ICD-10-CM | POA: Diagnosis not present

## 2017-01-05 DIAGNOSIS — D225 Melanocytic nevi of trunk: Secondary | ICD-10-CM | POA: Diagnosis not present

## 2017-01-06 DIAGNOSIS — R1032 Left lower quadrant pain: Secondary | ICD-10-CM | POA: Diagnosis not present

## 2017-01-21 DIAGNOSIS — R1084 Generalized abdominal pain: Secondary | ICD-10-CM | POA: Diagnosis not present

## 2017-02-18 DIAGNOSIS — H5213 Myopia, bilateral: Secondary | ICD-10-CM | POA: Diagnosis not present

## 2017-05-14 ENCOUNTER — Telehealth: Payer: 59 | Admitting: Family

## 2017-05-14 DIAGNOSIS — N390 Urinary tract infection, site not specified: Secondary | ICD-10-CM | POA: Diagnosis not present

## 2017-05-14 MED ORDER — PHENAZOPYRIDINE HCL 100 MG PO TABS: 100.0000 mg | ORAL_TABLET | Freq: Three times a day (TID) | ORAL | 0 refills | Status: DC | PRN

## 2017-05-14 MED ORDER — CEPHALEXIN 500 MG PO CAPS: 500.0000 mg | ORAL_CAPSULE | Freq: Two times a day (BID) | ORAL | 0 refills | Status: DC

## 2017-05-14 NOTE — Progress Notes (Signed)
Thank you for the details you included in the comment boxes. Those details are very helpful in determining the best course of treatment for you and help [place] to provide the best care. I have sent Pyridium in addition to the antibiotics, 100mg , take 3 times daily as needed for urinary burning/pain.  We are sorry that you are not feeling well.  Here is how we plan to help!  Based on what you shared with me it looks like you most likely have a simple urinary tract infection.  A UTI (Urinary Tract Infection) is a bacterial infection of the bladder.  Most cases of urinary tract infections are simple to treat but a key part of your care is to encourage you to drink plenty of fluids and watch your symptoms carefully.  I have prescribed Keflex 500 mg twice a day for 7 days.  Your symptoms should gradually improve. Call us if the burning in your urine worsens, you develop worsening fever, back pain or pelvic pain or if your symptoms do not resolve after completing the antibiotic.  Urinary tract infections can be prevented by drinking plenty of water to keep your body hydrated.  Also be sure when you wipe, wipe from front to back and don't hold it in!  If possible, empty your bladder every 4 hours.  Your e-visit answers were reviewed by a board certified advanced clinical practitioner to complete your personal care plan.  Depending on the condition, your plan could have included both over the counter or prescription medications.  If there is a problem please reply  once you have received a response from your provider.  Your safety is important to [place].  If you have drug allergies check your prescription carefully.    You can use MyChart to ask questions about today's visit, request a non-urgent call back, or ask for a work or school excuse for 24 hours related to this e-Visit. If it has been greater than 24 hours you will need to follow up with your provider, or enter a new e-Visit to address those  concerns.   You will get an e-mail in the next two days asking about your experience.  I hope that your e-visit has been valuable and will speed your recovery. Thank you for using e-visits.

## 2017-06-08 DIAGNOSIS — D649 Anemia, unspecified: Secondary | ICD-10-CM | POA: Diagnosis not present

## 2017-06-08 DIAGNOSIS — R600 Localized edema: Secondary | ICD-10-CM | POA: Diagnosis not present

## 2017-06-08 DIAGNOSIS — Z0000 Encounter for general adult medical examination without abnormal findings: Secondary | ICD-10-CM | POA: Diagnosis not present

## 2017-06-17 DIAGNOSIS — J3089 Other allergic rhinitis: Secondary | ICD-10-CM | POA: Diagnosis not present

## 2017-06-17 DIAGNOSIS — M5136 Other intervertebral disc degeneration, lumbar region: Secondary | ICD-10-CM | POA: Diagnosis not present

## 2017-06-17 DIAGNOSIS — Z0000 Encounter for general adult medical examination without abnormal findings: Secondary | ICD-10-CM | POA: Diagnosis not present

## 2017-06-17 DIAGNOSIS — D649 Anemia, unspecified: Secondary | ICD-10-CM | POA: Diagnosis not present

## 2017-08-19 ENCOUNTER — Other Ambulatory Visit: Payer: Self-pay

## 2017-08-19 ENCOUNTER — Ambulatory Visit
Admission: RE | Admit: 2017-08-19 | Discharge: 2017-08-19 | Disposition: A | Discharge status: Home / Self Care | Payer: 59 | Source: Ambulatory Visit | Attending: Obstetrics and Gynecology | Admitting: Obstetrics and Gynecology

## 2017-08-19 ENCOUNTER — Ambulatory Visit (INDEPENDENT_AMBULATORY_CARE_PROVIDER_SITE_OTHER): Payer: 59 | Admitting: Obstetrics and Gynecology

## 2017-08-19 ENCOUNTER — Encounter: Payer: Self-pay | Admitting: Obstetrics and Gynecology

## 2017-08-19 VITALS — BP 100/66 | HR 96 | Ht 60.0 in | Wt 154.0 lb

## 2017-08-19 DIAGNOSIS — R1031 Right lower quadrant pain: Secondary | ICD-10-CM | POA: Insufficient documentation

## 2017-08-19 DIAGNOSIS — R300 Dysuria: Secondary | ICD-10-CM | POA: Diagnosis not present

## 2017-08-19 DIAGNOSIS — R12 Heartburn: Secondary | ICD-10-CM | POA: Diagnosis not present

## 2017-08-19 NOTE — Addendum Note (Signed)
Addended by: Prentice Docker D on: 08/19/2017 01:40 PM   Modules accepted: Orders

## 2017-08-19 NOTE — Progress Notes (Addendum)
Obstetrics & Gynecology Office Visit   Chief Complaint  Patient presents with  . Gynecologic Exam    Abd pain RLQ x 1 wk   History of Present Illness: 52 y.o. G34P0 female who presents with abdominal pain.  She has noted mild pain for several months. The pain has gotten worse over the past week. The pain is located in her right lower quadrant.  She describes the pain as aching and pressure.  She rates the pain as 5/10 (more of a nuisance pain).  The pain does not move around.  Alleviating factors: none. She has tried Tylenol without relief.  Aggravating factors: none. Associated symptoms: difficulty initiating urine once.  She has had dysuria, which is not currently present.  She has a history of diverticulosis. She thought this might be a flare of this and was placed on bactrim and flagyl.  She has been taking the medication until this morning.  She is having trouble sleeping while taking the antibiotics.  Denies hematuria.  Denies hematochezia and melena.  She had more diarrhea-like symptoms over the past weekend, but has been more constipated during the week. She had noted a narrow-caliber to her stool earlier in the week. Now they are normal. Denies vaginal symptoms. Denies itching, burning, or irritation of her vagina.  She denies fevers, chills, nausea and vomiting.  Her pain is not affected by eating.  She had a hysterectomy in 2010 (laparoscopic) supracervical.  Denies vaginal bleeding. She had a hysterectomy due to excessive bleeding.   Past Medical History:  Diagnosis Date  . Diverticulosis   . GERD (gastroesophageal reflux disease)     Past Surgical History:  Procedure Laterality Date  . BACK SURGERY  2009  . CESAREAN SECTION  [id number]  . CHOLECYSTECTOMY  1999  . DILATION AND CURETTAGE OF UTERUS  [phone]  . LAPAROSCOPIC SUPRACERVICAL HYSTERECTOMY  2010    Gynecologic History: No LMP recorded. Patient has had a hysterectomy.  Obstetric History: G3P0  Family History    Problem Relation Age of Onset  . Breast cancer Paternal Aunt 53  . Kidney cancer Father   . Brain cancer Maternal Uncle   . Colon cancer Paternal Uncle     Social History   Socioeconomic History  . Marital status: Married    Spouse name: Not on file  . Number of children: Not on file  . Years of education: Not on file  . Highest education level: Not on file  Occupational History  . Not on file  Social Needs  . Financial resource strain: Not on file  . Food insecurity:    Worry: Not on file    Inability: Not on file  . Transportation needs:    Medical: Not on file    Non-medical: Not on file  Tobacco Use  . Smoking status: Never Smoker  . Smokeless tobacco: Never Used  Substance and Sexual Activity  . Alcohol use: No  . Drug use: No  . Sexual activity: Yes  Lifestyle  . Physical activity:    Days per week: Not on file    Minutes per session: Not on file  . Stress: Not on file  Relationships  . Social connections:    Talks on phone: Not on file    Gets together: Not on file    Attends religious service: Not on file    Active member of club or organization: Not on file    Attends meetings of clubs or organizations: Not on file  Relationship status: Not on file  . Intimate partner violence:    Fear of current or ex partner: Not on file    Emotionally abused: Not on file    Physically abused: Not on file    Forced sexual activity: Not on file  Other Topics Concern  . Not on file  Social History Narrative  . Not on file    Allergies  Allergen Reactions  . Norflex [Orphenadrine Citrate] Other (See Comments)    Seizure     Prior to Admission medications   Medication Sig Start Date End Date Taking? Authorizing Provider  azelastine (ASTELIN) 0.1 % nasal spray Place into both nostrils 2 (two) times daily. Use in each nostril as directed   Yes [provider]  DULoxetine (CYMBALTA) 60 MG capsule Take 60 mg by mouth daily.  05/30/15  Yes [provider]  esomeprazole (NEXIUM) 40 MG capsule Take 40 mg by mouth daily at 12 noon.  05/30/15  Yes [provider]  fexofenadine (ALLEGRA) 180 MG tablet Take 180 mg by mouth daily.  05/30/15  Yes [provider]  hydrochlorothiazide (HYDRODIURIL) 25 MG tablet Take by mouth. 12/27/16 12/27/17 Yes [provider]  LORazepam (ATIVAN) 0.5 MG tablet Take by mouth. 05/30/15  Yes [provider]  metaxalone (SKELAXIN) 800 MG tablet Take 800 mg by mouth 3 (three) times daily.  05/30/15  Yes [provider]  metroNIDAZOLE (FLAGYL) 500 MG tablet Take by mouth. 08/15/17 08/25/17 Yes [provider]  mometasone (NASONEX) 50 MCG/ACT nasal spray Place 2 sprays into the nose as needed.   Yes [provider]  sulfamethoxazole-trimethoprim (BACTRIM DS,SEPTRA DS) 800-160 MG tablet Take by mouth. 08/15/17 08/25/17 Yes [provider]  hydrochlorothiazide (HYDRODIURIL) 25 MG tablet Take 25 mg by mouth daily.  06/26/15 06/25/16  [provider]    Review of Systems  Constitutional: Negative.   HENT: Negative.   Eyes: Negative.   Respiratory: Negative.   Cardiovascular: Negative.   Gastrointestinal: Positive for abdominal pain, constipation and heartburn. Negative for blood in stool, diarrhea, melena, nausea and vomiting.  Genitourinary: Positive for dysuria and frequency. Negative for flank pain, hematuria and urgency.  Musculoskeletal: Positive for myalgias. Negative for back pain, falls, joint pain and neck pain.  Skin: Negative.   Neurological: Negative.   Psychiatric/Behavioral: Negative.      Physical Exam BP 100/66 (BP Location: Left Arm, Patient Position: Sitting, Cuff Size: Normal)   Pulse 96   Ht 5' (1.524 m)   Wt 154 lb (69.9 kg)   BMI 30.08 kg/m  No LMP recorded. Patient has had a hysterectomy. Physical Exam  Constitutional: She is oriented to person, place, and time. She appears well-developed and well-nourished. No  distress.  Genitourinary: Vagina normal. Pelvic exam was performed with patient supine. There is no rash, tenderness or lesion on the right labia. There is no rash, tenderness or lesion on the left labia.  Right adnexum displays tenderness. Right adnexum does not display mass and does not display fullness. Left adnexum does not display mass, does not display tenderness and does not display fullness. Cervix does not exhibit motion tenderness, lesion or polyp.  Genitourinary Comments: Uterus surgically absent.  No obvious masses palpated.  Mild tenderness in RLQ correlating abdominal exam  HENT:  Head: Normocephalic and atraumatic.  Eyes: EOM are normal. No scleral icterus.  Neck: Normal range of motion. Neck supple.  Cardiovascular: Normal rate and regular rhythm.  Pulmonary/Chest: Effort normal and breath sounds normal.  No respiratory distress. She has no wheezes. She has no rales.  Abdominal: Soft. Bowel sounds are normal. She exhibits no distension and no mass. There is tenderness (Just right of umbilicus, mild LLQ). There is no rebound and no guarding.  Musculoskeletal: Normal range of motion. She exhibits no edema.  Neurological: She is alert and oriented to person, place, and time. No cranial nerve deficit.  Skin: Skin is warm and dry. No erythema.  Psychiatric: She has a normal mood and affect. Her behavior is normal. Judgment normal.    Female chaperone present for pelvic and breast  portions of the physical exam  Assessment: 52 y.o. G72P0 female here for  1. Right lower quadrant abdominal pain      Plan: Problem List Items Addressed This Visit      Other   Right lower quadrant abdominal pain - Primary   Relevant Orders   US PELVIS TRANSVANGINAL NON-OB (TV ONLY)     Discussed multiple potential etiologies of her pain.  These include GI (appendix, IBD ,etc), adhesive disease with her prior surgical history, gynecologic (ovarian cyst), GU, MSK.  She also is tender in her LLQ which  may support the diagnosis of diverticulitis.  I encouraged her to continue her antibiotics. She has some symptoms that might be consistent with a UTI. However, the antibiotics she is taking should cover cystitis.  Will obtain a pelvic ultrasound.  If negative, then will defer to her PCP. Otherwise, will treat as indicated.  Precautions for worsening pain, fever, etc., given and instructions to go to the ER should these symptoms occur.  Prentice Docker, MD 08/19/2017 12:32 PM     ADDENDUM: Ultrasound obtained through [facility].  No obvious etiology for her pain noted.  Neither ovary visualized on the study.  I called the patient and discussed that no obvious gynecologic source could be found. At this time I will defer to her PCP for further workup.

## 2017-08-24 ENCOUNTER — Other Ambulatory Visit: Payer: Self-pay | Admitting: Physician Assistant

## 2017-08-24 ENCOUNTER — Ambulatory Visit
Admission: RE | Admit: 2017-08-24 | Discharge: 2017-08-24 | Disposition: A | Discharge status: Home / Self Care | Payer: 59 | Source: Ambulatory Visit | Attending: Physician Assistant | Admitting: Physician Assistant

## 2017-08-24 DIAGNOSIS — Z90710 Acquired absence of both cervix and uterus: Secondary | ICD-10-CM | POA: Diagnosis not present

## 2017-08-24 DIAGNOSIS — R911 Solitary pulmonary nodule: Secondary | ICD-10-CM | POA: Diagnosis not present

## 2017-08-24 DIAGNOSIS — K5730 Diverticulosis of large intestine without perforation or abscess without bleeding: Secondary | ICD-10-CM | POA: Diagnosis not present

## 2017-08-24 DIAGNOSIS — Z9049 Acquired absence of other specified parts of digestive tract: Secondary | ICD-10-CM | POA: Diagnosis not present

## 2017-08-24 DIAGNOSIS — R1031 Right lower quadrant pain: Secondary | ICD-10-CM | POA: Insufficient documentation

## 2017-08-24 DIAGNOSIS — R109 Unspecified abdominal pain: Secondary | ICD-10-CM | POA: Diagnosis not present

## 2017-08-24 MED ORDER — IOPAMIDOL (ISOVUE-300) INJECTION 61%
100.0000 mL | Freq: Once | INTRAVENOUS | Status: AC | PRN
  Administered 2017-08-24: 100 mL via INTRAVENOUS

## 2017-08-30 DIAGNOSIS — M5136 Other intervertebral disc degeneration, lumbar region: Secondary | ICD-10-CM | POA: Diagnosis not present

## 2017-08-30 DIAGNOSIS — R1031 Right lower quadrant pain: Secondary | ICD-10-CM | POA: Diagnosis not present

## 2017-09-12 DIAGNOSIS — R1031 Right lower quadrant pain: Secondary | ICD-10-CM | POA: Diagnosis not present

## 2017-11-03 DIAGNOSIS — K640 First degree hemorrhoids: Secondary | ICD-10-CM | POA: Diagnosis not present

## 2017-11-03 DIAGNOSIS — Z8371 Family history of colonic polyps: Secondary | ICD-10-CM | POA: Diagnosis not present

## 2017-11-03 DIAGNOSIS — D125 Benign neoplasm of sigmoid colon: Secondary | ICD-10-CM | POA: Diagnosis not present

## 2017-11-03 DIAGNOSIS — R1031 Right lower quadrant pain: Secondary | ICD-10-CM | POA: Diagnosis not present

## 2017-11-03 DIAGNOSIS — K635 Polyp of colon: Secondary | ICD-10-CM | POA: Diagnosis not present

## 2017-11-15 ENCOUNTER — Other Ambulatory Visit: Payer: Self-pay | Admitting: Obstetrics & Gynecology

## 2017-11-15 DIAGNOSIS — Z1231 Encounter for screening mammogram for malignant neoplasm of breast: Secondary | ICD-10-CM

## 2017-12-02 ENCOUNTER — Ambulatory Visit (INDEPENDENT_AMBULATORY_CARE_PROVIDER_SITE_OTHER): Payer: 59 | Admitting: Obstetrics & Gynecology

## 2017-12-02 ENCOUNTER — Telehealth: Payer: Self-pay | Admitting: Obstetrics & Gynecology

## 2017-12-02 ENCOUNTER — Encounter: Payer: Self-pay | Admitting: Obstetrics & Gynecology

## 2017-12-02 VITALS — BP 120/80 | Ht 60.0 in | Wt 158.0 lb

## 2017-12-02 DIAGNOSIS — G8929 Other chronic pain: Secondary | ICD-10-CM

## 2017-12-02 DIAGNOSIS — Z01419 Encounter for gynecological examination (general) (routine) without abnormal findings: Secondary | ICD-10-CM | POA: Diagnosis not present

## 2017-12-02 DIAGNOSIS — R1031 Right lower quadrant pain: Secondary | ICD-10-CM

## 2017-12-02 DIAGNOSIS — R1084 Generalized abdominal pain: Secondary | ICD-10-CM

## 2017-12-02 DIAGNOSIS — Z780 Asymptomatic menopausal state: Secondary | ICD-10-CM | POA: Diagnosis not present

## 2017-12-02 DIAGNOSIS — Z1239 Encounter for other screening for malignant neoplasm of breast: Secondary | ICD-10-CM

## 2017-12-02 DIAGNOSIS — Z0000 Encounter for general adult medical examination without abnormal findings: Secondary | ICD-10-CM

## 2017-12-02 NOTE — Telephone Encounter (Signed)
Patient is aware of MRI on 12/16/17 @ 3:00pm at [facility]. Patient is aware to arrive at 2:30pm at the [facility] registration desk, and that she must be NPO 4 hrs prior. Patient is aware she can call Centralized Scheduling at [phone] each day after 3:30pm to check for cancellations.

## 2017-12-02 NOTE — Patient Instructions (Signed)
PAP every 5 years Mammogram every year     at [facility] Colonoscopy every 10 years Labs yearly (with PCP)

## 2017-12-02 NOTE — Progress Notes (Signed)
HPI:      [name] is a 52 y.o. G3P3000 who LMP was in the past, she presents today for her annual examination. Prior [facility] for ENDOMETRIOSIS.   The patient has no complaints today. The patient is sexually active. Herlast pap: approximate date 2018 and was normal and last mammogram: approximate date 2018 and was normal.  The patient does perform self breast exams.  There is no notable family history of breast or ovarian cancer in her family. The patient is not taking hormone replacement therapy. Patient denies post-menopausal vaginal bleeding.   The patient has regular exercise: yes. The patient denies current symptoms of depression.    GYN Hx: Last Colonoscopy:few months ago. Normal.  Last DEXA: never ago.    PMHx: Past Medical History:  Diagnosis Date  . Diverticulosis   . GERD (gastroesophageal reflux disease)    Past Surgical History:  Procedure Laterality Date  . BACK SURGERY  2009  . CESAREAN SECTION  [id number]  . CHOLECYSTECTOMY  1999  . COLONOSCOPY    . DILATION AND CURETTAGE OF UTERUS  [phone]  . LAPAROSCOPIC SUPRACERVICAL HYSTERECTOMY  2010   Family History  Problem Relation Age of Onset  . Breast cancer Paternal Aunt 70  . Kidney cancer Father   . Brain cancer Maternal Uncle   . Colon cancer Paternal Uncle    Social History   Tobacco Use  . Smoking status: Never Smoker  . Smokeless tobacco: Never Used  Substance Use Topics  . Alcohol use: No  . Drug use: No    Current Outpatient Medications:  .  azelastine (ASTELIN) 0.1 % nasal spray, Place into both nostrils 2 (two) times daily. Use in each nostril as directed, Disp: , Rfl:  .  DULoxetine (CYMBALTA) 60 MG capsule, Take 60 mg by mouth daily. , Disp: , Rfl:  .  esomeprazole (NEXIUM) 40 MG capsule, Take 40 mg by mouth daily at 12 noon. , Disp: , Rfl:  .  fexofenadine (ALLEGRA) 180 MG tablet, Take 180 mg by mouth daily. , Disp: , Rfl:  .  hydrochlorothiazide (HYDRODIURIL) 25 MG tablet, Take by  mouth., Disp: , Rfl:  .  LORazepam (ATIVAN) 0.5 MG tablet, Take by mouth., Disp: , Rfl:  .  metaxalone (SKELAXIN) 800 MG tablet, Take 800 mg by mouth 3 (three) times daily. , Disp: , Rfl:  .  mometasone (NASONEX) 50 MCG/ACT nasal spray, Place 2 sprays into the nose as needed., Disp: , Rfl:  .  hydrochlorothiazide (HYDRODIURIL) 25 MG tablet, Take 25 mg by mouth daily. , Disp: , Rfl:  Allergies: Norflex [orphenadrine citrate]  Review of Systems  Constitutional: Negative for chills, fever and malaise/fatigue.  HENT: Negative for congestion, sinus pain and sore throat.   Eyes: Negative for blurred vision and pain.  Respiratory: Negative for cough and wheezing.   Cardiovascular: Negative for chest pain and leg swelling.  Gastrointestinal: Negative for abdominal pain, constipation, diarrhea, heartburn, nausea and vomiting.  Genitourinary: Negative for dysuria, frequency, hematuria and urgency.  Musculoskeletal: Negative for back pain, joint pain, myalgias and neck pain.  Skin: Negative for itching and rash.  Neurological: Negative for dizziness, tremors and weakness.  Endo/Heme/Allergies: Does not bruise/bleed easily.  Psychiatric/Behavioral: Negative for depression. The patient is not nervous/anxious and does not have insomnia.     Objective: BP 120/80   Ht 5' (1.524 m)   Wt 158 lb (71.7 kg)   BMI 30.86 kg/m   Filed Weights   12/02/17 1331  Weight: 158 lb (71.7 kg)   Body mass index is 30.86 kg/m. Physical Exam  Constitutional: She is oriented to person, place, and time. She appears well-developed and well-nourished. No distress.  Genitourinary: Rectum normal and vagina normal. Pelvic exam was performed with patient supine. There is no rash or lesion on the right labia. There is no rash or lesion on the left labia. Vagina exhibits no lesion. No bleeding in the vagina.  Right adnexum displays tenderness. Right adnexum does not display mass. Left adnexum does not display mass and does  not display tenderness. Cervix does not exhibit motion tenderness, lesion, friability or polyp.  Genitourinary Comments: RLQ T no mass  HENT:  Head: Normocephalic and atraumatic. Head is without laceration.  Right Ear: Hearing normal.  Left Ear: Hearing normal.  Nose: No epistaxis.  No foreign bodies.  Mouth/Throat: Uvula is midline, oropharynx is clear and moist and mucous membranes are normal.  Eyes: Pupils are equal, round, and reactive to light.  Neck: Normal range of motion. Neck supple. No thyromegaly present.  Cardiovascular: Normal rate and regular rhythm. Exam reveals no gallop and no friction rub.  No murmur heard. Pulmonary/Chest: Effort normal and breath sounds normal. No respiratory distress. She has no wheezes. Right breast exhibits no mass, no skin change and no tenderness. Left breast exhibits no mass, no skin change and no tenderness.  Abdominal: Soft. Bowel sounds are normal. She exhibits no distension. There is no tenderness. There is no rebound.  Musculoskeletal: Normal range of motion.  Neurological: She is alert and oriented to person, place, and time. No cranial nerve deficit.  Skin: Skin is warm and dry.  Psychiatric: She has a normal mood and affect. Judgment normal.  Vitals reviewed.   Assessment: Annual Exam 1. Screening for breast cancer   2. Annual physical exam   3. Menopause   4. Generalized abdominal pain   5. Chronic RLQ pain    Plan:            1.  Cervical Screening-  Pap smear schedule reviewed with patient, every three years as is s/p LSH  2. Breast screening- Exam annually and mammogram scheduled  3. Colonoscopy every 10 years, Hemoccult testing after age 45  4. Labs managed by PCP  5. Counseling for hormonal therapy: none Will check hormone levels today regarding menopause, and looking for etiology for her pain (endometriosis) Pain has been eval by CT and [place] and colonoscopy, no etiology found Plan MR study Counseled on pros and cons of  Dx Lap, LOA, RSO (BSO) Still has cervix but it is well supported and would be difficult removal, not area of her pain     F/U  Return in about 1 year (around 12/03/2018) for Annual.  Barnett Applebaum, MD, [facility], [facility] 12/02/2017  2:19 PM

## 2017-12-03 LAB — FSH/LH
FSH: 72.5 m[IU]/mL
LH: 46.9 m[IU]/mL

## 2017-12-03 LAB — ESTRADIOL: ESTRADIOL: 12.5 pg/mL

## 2017-12-05 NOTE — Progress Notes (Signed)
Pt aware.

## 2017-12-05 NOTE — Progress Notes (Signed)
Let her know labs are reviewed by me and will also see what MRI shows Thursday, then will get back in touch with her.  Thx.  (no lab concerns)

## 2017-12-08 ENCOUNTER — Ambulatory Visit
Admission: RE | Admit: 2017-12-08 | Discharge: 2017-12-08 | Disposition: A | Discharge status: Home / Self Care | Payer: 59 | Source: Ambulatory Visit | Attending: Obstetrics & Gynecology | Admitting: Obstetrics & Gynecology

## 2017-12-08 DIAGNOSIS — Z1231 Encounter for screening mammogram for malignant neoplasm of breast: Secondary | ICD-10-CM | POA: Diagnosis not present

## 2017-12-09 ENCOUNTER — Other Ambulatory Visit: Payer: Self-pay | Admitting: Obstetrics & Gynecology

## 2017-12-09 DIAGNOSIS — R928 Other abnormal and inconclusive findings on diagnostic imaging of breast: Secondary | ICD-10-CM

## 2017-12-09 DIAGNOSIS — N6489 Other specified disorders of breast: Secondary | ICD-10-CM

## 2017-12-13 DIAGNOSIS — E871 Hypo-osmolality and hyponatremia: Secondary | ICD-10-CM | POA: Diagnosis not present

## 2017-12-13 DIAGNOSIS — E876 Hypokalemia: Secondary | ICD-10-CM | POA: Diagnosis not present

## 2017-12-16 ENCOUNTER — Ambulatory Visit
Admission: RE | Admit: 2017-12-16 | Discharge: 2017-12-16 | Disposition: A | Discharge status: Home / Self Care | Payer: 59 | Source: Ambulatory Visit | Attending: Obstetrics & Gynecology | Admitting: Obstetrics & Gynecology

## 2017-12-16 ENCOUNTER — Other Ambulatory Visit: Payer: Self-pay | Admitting: Obstetrics & Gynecology

## 2017-12-16 DIAGNOSIS — K439 Ventral hernia without obstruction or gangrene: Secondary | ICD-10-CM | POA: Insufficient documentation

## 2017-12-16 DIAGNOSIS — R1084 Generalized abdominal pain: Secondary | ICD-10-CM | POA: Insufficient documentation

## 2017-12-16 DIAGNOSIS — K429 Umbilical hernia without obstruction or gangrene: Secondary | ICD-10-CM | POA: Diagnosis not present

## 2017-12-16 MED ORDER — GADOBUTROL 1 MMOL/ML IV SOLN
7.5000 mL | Freq: Once | INTRAVENOUS | Status: AC | PRN
  Administered 2017-12-16: 7 mL via INTRAVENOUS

## 2017-12-20 DIAGNOSIS — D649 Anemia, unspecified: Secondary | ICD-10-CM | POA: Diagnosis not present

## 2017-12-20 DIAGNOSIS — R1013 Epigastric pain: Secondary | ICD-10-CM | POA: Diagnosis not present

## 2017-12-20 DIAGNOSIS — K279 Peptic ulcer, site unspecified, unspecified as acute or chronic, without hemorrhage or perforation: Secondary | ICD-10-CM | POA: Diagnosis not present

## 2017-12-20 DIAGNOSIS — M5136 Other intervertebral disc degeneration, lumbar region: Secondary | ICD-10-CM | POA: Diagnosis not present

## 2017-12-21 ENCOUNTER — Encounter: Payer: Self-pay | Admitting: Obstetrics & Gynecology

## 2017-12-22 ENCOUNTER — Ambulatory Visit
Admission: RE | Admit: 2017-12-22 | Discharge: 2017-12-22 | Disposition: A | Discharge status: Home / Self Care | Payer: 59 | Source: Ambulatory Visit | Attending: Obstetrics & Gynecology | Admitting: Obstetrics & Gynecology

## 2017-12-22 ENCOUNTER — Other Ambulatory Visit: Payer: Self-pay | Admitting: Obstetrics & Gynecology

## 2017-12-22 DIAGNOSIS — R928 Other abnormal and inconclusive findings on diagnostic imaging of breast: Secondary | ICD-10-CM

## 2017-12-22 DIAGNOSIS — R922 Inconclusive mammogram: Secondary | ICD-10-CM | POA: Diagnosis not present

## 2017-12-22 DIAGNOSIS — N6489 Other specified disorders of breast: Secondary | ICD-10-CM

## 2017-12-22 DIAGNOSIS — N809 Endometriosis, unspecified: Secondary | ICD-10-CM

## 2017-12-22 NOTE — Telephone Encounter (Signed)
Surgery Booking Request Patient Full Name:  Nimra Puccinelli  MRN: [id number]  DOB: [birth year]  Surgeon: Hoyt Koch, MD  Requested Surgery Date and Time: 01/19/18 Primary Diagnosis AND Code: RLQ PAIN, ENDOMETRIOSIS Secondary Diagnosis and Code:  Surgical Procedure: LAPAROSCOPY, BILATERAL SALPINGO OOPHORECTOMY L&D Notification: No Admission Status: same day surgery Length of Surgery: 1 hr Special Case Needs: Supine H&P: yes (date) Phone Interview???: yes Interpreter: Language:  Medical Clearance: no Special Scheduling Instructions: no

## 2017-12-22 NOTE — Progress Notes (Signed)
Discussed MRI, other imaging, and labs results w patient Continues w particular RLQ pain w h/o endometriosis and prior LSH/other surgeries Pros and cons of laparoscopy, BSO, LOA d/w pt, recovery also discussed She desires to schedule for Dec 19 Barnett Applebaum, MD, [facility], [facility] 12/22/2017  7:41 AM

## 2017-12-23 ENCOUNTER — Telehealth: Payer: Self-pay | Admitting: Obstetrics & Gynecology

## 2017-12-23 NOTE — Telephone Encounter (Signed)
Patient is aware of H&P at [facility] on 01/06/18 @ 8:40am w/ Dr Kenton Kingfisher, Pre-admit Testing phone interview to be scheduled, and OR on 01/19/18. Patient is aware she may receive calls from the [place] and [facility]. Patient confirmed UMR and no secondary insurance. Patient intends to drop off FMLA paperwork next week.

## 2017-12-23 NOTE — Telephone Encounter (Signed)
Patient is not sure whether she wants to schedule in December or January. Will check work schedule and call back. Phone# and ext given.

## 2018-01-06 ENCOUNTER — Ambulatory Visit (INDEPENDENT_AMBULATORY_CARE_PROVIDER_SITE_OTHER): Payer: 59 | Admitting: Obstetrics & Gynecology

## 2018-01-06 ENCOUNTER — Encounter: Payer: Self-pay | Admitting: Obstetrics & Gynecology

## 2018-01-06 VITALS — BP 118/78 | Ht 60.0 in | Wt 158.0 lb

## 2018-01-06 DIAGNOSIS — N809 Endometriosis, unspecified: Secondary | ICD-10-CM | POA: Diagnosis not present

## 2018-01-06 DIAGNOSIS — R1084 Generalized abdominal pain: Secondary | ICD-10-CM

## 2018-01-06 NOTE — Progress Notes (Signed)
PRE-OPERATIVE HISTORY AND PHYSICAL EXAM  HPI:  Paige Robles is a 52 y.o. G3P3000 No LMP recorded. Patient has had a hysterectomy.; she is being admitted for surgery related to pelvic pain.   Pt has h/o endometriosis and even though she has had [facility] 2010 she still has predominant RLQ pains with no other explanation. Pain is intermittent and moderate at times. No bowel or bladder concerns.  Menopause sx's in past, min now.   PMHx: Past Medical History:  Diagnosis Date  . Diverticulosis   . Endometriosis   . GERD (gastroesophageal reflux disease)    Past Surgical History:  Procedure Laterality Date  . BACK SURGERY  2009  . CESAREAN SECTION  [id number]  . CHOLECYSTECTOMY  1999  . COLONOSCOPY    . DILATION AND CURETTAGE OF UTERUS  [phone]  . LAPAROSCOPIC SUPRACERVICAL HYSTERECTOMY  2010   Family History  Problem Relation Age of Onset  . Breast cancer Paternal Aunt 98  . Kidney cancer Father   . Brain cancer Maternal Uncle   . Colon cancer Paternal Uncle    Social History   Tobacco Use  . Smoking status: Never Smoker  . Smokeless tobacco: Never Used  Substance Use Topics  . Alcohol use: No  . Drug use: No    Current Outpatient Medications:  .  azelastine (ASTELIN) 0.1 % nasal spray, Place into both nostrils 2 (two) times daily. Use in each nostril as directed, Disp: , Rfl:  .  DULoxetine (CYMBALTA) 60 MG capsule, Take 60 mg by mouth daily. , Disp: , Rfl:  .  esomeprazole (NEXIUM) 40 MG capsule, Take 40 mg by mouth daily at 12 noon. , Disp: , Rfl:  .  fexofenadine (ALLEGRA) 180 MG tablet, Take 180 mg by mouth daily. , Disp: , Rfl:  .  LORazepam (ATIVAN) 0.5 MG tablet, Take by mouth., Disp: , Rfl:  .  metaxalone (SKELAXIN) 800 MG tablet, Take 800 mg by mouth 3 (three) times daily. , Disp: , Rfl:  .  mometasone (NASONEX) 50 MCG/ACT nasal spray, Place 2 sprays into the nose as needed., Disp: , Rfl:  .  hydrochlorothiazide (HYDRODIURIL) 25 MG tablet, Take 25 mg by  mouth daily. , Disp: , Rfl:  .  hydrochlorothiazide (HYDRODIURIL) 25 MG tablet, Take by mouth., Disp: , Rfl:  Allergies: Orphenadrine citrate  Review of Systems  Constitutional: Negative for chills, fever and malaise/fatigue.  HENT: Negative for congestion, sinus pain and sore throat.   Eyes: Negative for blurred vision and pain.  Respiratory: Negative for cough and wheezing.   Cardiovascular: Negative for chest pain and leg swelling.  Gastrointestinal: Negative for abdominal pain, constipation, diarrhea, heartburn, nausea and vomiting.  Genitourinary: Negative for dysuria, frequency, hematuria and urgency.  Musculoskeletal: Negative for back pain, joint pain, myalgias and neck pain.  Skin: Negative for itching and rash.  Neurological: Negative for dizziness, tremors and weakness.  Endo/Heme/Allergies: Does not bruise/bleed easily.  Psychiatric/Behavioral: Negative for depression. The patient is not nervous/anxious and does not have insomnia.     Objective: BP 118/78   Ht 5' (1.524 m)   Wt 158 lb (71.7 kg)   BMI 30.86 kg/m   Filed Weights   01/06/18 0848  Weight: 158 lb (71.7 kg)   Physical Exam  Constitutional: She is oriented to person, place, and time. She appears well-developed and well-nourished. No distress.  HENT:  Head: Normocephalic and atraumatic. Head is without laceration.  Right Ear: Hearing normal.  Left Ear: Hearing  normal.  Nose: No epistaxis.  No foreign bodies.  Mouth/Throat: Uvula is midline, oropharynx is clear and moist and mucous membranes are normal.  Eyes: Pupils are equal, round, and reactive to light.  Neck: Normal range of motion. Neck supple. No thyromegaly present.  Cardiovascular: Normal rate and regular rhythm. Exam reveals no gallop and no friction rub.  No murmur heard. Pulmonary/Chest: Effort normal and breath sounds normal. No respiratory distress. She has no wheezes. Right breast exhibits no mass, no skin change and no tenderness. Left  breast exhibits no mass, no skin change and no tenderness.  Abdominal: Soft. Bowel sounds are normal. She exhibits no distension. There is no tenderness. There is no rebound.  Musculoskeletal: Normal range of motion.  Neurological: She is alert and oriented to person, place, and time. No cranial nerve deficit.  Skin: Skin is warm and dry.  Psychiatric: She has a normal mood and affect. Judgment normal.  Vitals reviewed.  Assessment: 1. Endometriosis   2. Generalized abdominal pain   Plan Laparoscopy and BSO and LOA as part of her evaluation and treatment plan Plans to RTW 02/07/2018  I have had a careful discussion with this patient about all the options available and the risk/benefits of each. I have fully informed this patient that surgery may subject her to a variety of discomforts and risks: She understands that most patients have surgery with little difficulty, but problems can happen ranging from minor to fatal. These include nausea, vomiting, pain, bleeding, infection, poor healing, hernia, or formation of adhesions. Unexpected reactions may occur from any drug or anesthetic given. Unintended injury may occur to other pelvic or abdominal structures such as Fallopian tubes, ovaries, bladder, ureter (tube from kidney to bladder), or bowel. Nerves going from the pelvis to the legs may be injured. Any such injury may require immediate or later additional surgery to correct the problem. Excessive blood loss requiring transfusion is very unlikely but possible. Dangerous blood clots may form in the legs or lungs. Physical and sexual activity will be restricted in varying degrees for an indeterminate period of time but most often 2-6 weeks.  Finally, she understands that it is impossible to list every possible undesirable effect and that the condition for which surgery is done is not always cured or significantly improved, and in rare cases may be even worse.Ample time was given to answer all  questions.  Barnett Applebaum, MD, [facility], [facility] 01/06/2018  8:55 AM

## 2018-01-06 NOTE — Patient Instructions (Signed)
General Gynecological Post-Operative Instructions You may expect to feel dizzy, weak, and drowsy for as long as 24 hours after receiving the medicine that made you sleep (anesthetic).  Do not drive a car, ride a bicycle, participate in physical activities, or take public transportation until you are done taking narcotic pain medicines or as directed by your doctor.  Do not drink alcohol or take tranquilizers.  Do not take medicine that has not been prescribed by your doctor.  Do not sign important papers or make important decisions while on narcotic pain medicines.  Have a responsible person with you.  CARE OF INCISION  Take showers instead of baths until your doctor gives you permission to take baths.  No sexual intercourse or placement of anything in the vagina for 1 weeks or as instructed by your doctor. Only take prescription or over-the-counter medicines  for pain, discomfort, or fever as directed by your doctor. Do not take aspirin. It can make you bleed. Take medicines (antibiotics) that kill germs if they are prescribed for you.  Call the office or go to the ER if:  You feel sick to your stomach (nauseous) and you start to throw up (vomit).  You have trouble eating or drinking.  You have an oral temperature above 101.  You have constipation that is not helped by adjusting diet or increasing fluid intake. Pain medicines are a common cause of constipation.  You have any other concerns. SEEK IMMEDIATE MEDICAL CARE IF:  You have persistent dizziness.  You have difficulty breathing or a congested sounding (croupy) cough.  You have an oral temperature above 102.5, not controlled by medicine.  There is increasing pain or tenderness near or in the surgical site.

## 2018-01-06 NOTE — H&P (View-Only) (Signed)
PRE-OPERATIVE HISTORY AND PHYSICAL EXAM  HPI:  Paige Robles is a 51 y.o. G3P3000 No LMP recorded. Patient has had a hysterectomy.; she is being admitted for surgery related to pelvic pain.   Pt has h/o endometriosis and even though she has had [facility] 2010 she still has predominant RLQ pains with no other explanation. Pain is intermittent and moderate at times. No bowel or bladder concerns.  Menopause sx's in past, min now.   PMHx: Past Medical History:  Diagnosis Date  . Diverticulosis   . Endometriosis   . GERD (gastroesophageal reflux disease)    Past Surgical History:  Procedure Laterality Date  . BACK SURGERY  2009  . CESAREAN SECTION  [id number]  . CHOLECYSTECTOMY  1999  . COLONOSCOPY    . DILATION AND CURETTAGE OF UTERUS  [phone]  . LAPAROSCOPIC SUPRACERVICAL HYSTERECTOMY  2010   Family History  Problem Relation Age of Onset  . Breast cancer Paternal Aunt 40  . Kidney cancer Father   . Brain cancer Maternal Uncle   . Colon cancer Paternal Uncle    Social History   Tobacco Use  . Smoking status: Never Smoker  . Smokeless tobacco: Never Used  Substance Use Topics  . Alcohol use: No  . Drug use: No    Current Outpatient Medications:  .  azelastine (ASTELIN) 0.1 % nasal spray, Place into both nostrils 2 (two) times daily. Use in each nostril as directed, Disp: , Rfl:  .  DULoxetine (CYMBALTA) 60 MG capsule, Take 60 mg by mouth daily. , Disp: , Rfl:  .  esomeprazole (NEXIUM) 40 MG capsule, Take 40 mg by mouth daily at 12 noon. , Disp: , Rfl:  .  fexofenadine (ALLEGRA) 180 MG tablet, Take 180 mg by mouth daily. , Disp: , Rfl:  .  LORazepam (ATIVAN) 0.5 MG tablet, Take by mouth., Disp: , Rfl:  .  metaxalone (SKELAXIN) 800 MG tablet, Take 800 mg by mouth 3 (three) times daily. , Disp: , Rfl:  .  mometasone (NASONEX) 50 MCG/ACT nasal spray, Place 2 sprays into the nose as needed., Disp: , Rfl:  .  hydrochlorothiazide (HYDRODIURIL) 25 MG tablet, Take 25 mg by  mouth daily. , Disp: , Rfl:  .  hydrochlorothiazide (HYDRODIURIL) 25 MG tablet, Take by mouth., Disp: , Rfl:  Allergies: Orphenadrine citrate  Review of Systems  Constitutional: Negative for chills, fever and malaise/fatigue.  HENT: Negative for congestion, sinus pain and sore throat.   Eyes: Negative for blurred vision and pain.  Respiratory: Negative for cough and wheezing.   Cardiovascular: Negative for chest pain and leg swelling.  Gastrointestinal: Negative for abdominal pain, constipation, diarrhea, heartburn, nausea and vomiting.  Genitourinary: Negative for dysuria, frequency, hematuria and urgency.  Musculoskeletal: Negative for back pain, joint pain, myalgias and neck pain.  Skin: Negative for itching and rash.  Neurological: Negative for dizziness, tremors and weakness.  Endo/Heme/Allergies: Does not bruise/bleed easily.  Psychiatric/Behavioral: Negative for depression. The patient is not nervous/anxious and does not have insomnia.     Objective: BP 118/78   Ht 5' (1.524 m)   Wt 158 lb (71.7 kg)   BMI 30.86 kg/m   Filed Weights   01/06/18 0848  Weight: 158 lb (71.7 kg)   Physical Exam  Constitutional: She is oriented to person, place, and time. She appears well-developed and well-nourished. No distress.  HENT:  Head: Normocephalic and atraumatic. Head is without laceration.  Right Ear: Hearing normal.  Left Ear: Hearing  normal.  Nose: No epistaxis.  No foreign bodies.  Mouth/Throat: Uvula is midline, oropharynx is clear and moist and mucous membranes are normal.  Eyes: Pupils are equal, round, and reactive to light.  Neck: Normal range of motion. Neck supple. No thyromegaly present.  Cardiovascular: Normal rate and regular rhythm. Exam reveals no gallop and no friction rub.  No murmur heard. Pulmonary/Chest: Effort normal and breath sounds normal. No respiratory distress. She has no wheezes. Right breast exhibits no mass, no skin change and no tenderness. Left  breast exhibits no mass, no skin change and no tenderness.  Abdominal: Soft. Bowel sounds are normal. She exhibits no distension. There is no tenderness. There is no rebound.  Musculoskeletal: Normal range of motion.  Neurological: She is alert and oriented to person, place, and time. No cranial nerve deficit.  Skin: Skin is warm and dry.  Psychiatric: She has a normal mood and affect. Judgment normal.  Vitals reviewed.  Assessment: 1. Endometriosis   2. Generalized abdominal pain   Plan Laparoscopy and BSO and LOA as part of her evaluation and treatment plan Plans to RTW 02/07/2018  I have had a careful discussion with this patient about all the options available and the risk/benefits of each. I have fully informed this patient that surgery may subject her to a variety of discomforts and risks: She understands that most patients have surgery with little difficulty, but problems can happen ranging from minor to fatal. These include nausea, vomiting, pain, bleeding, infection, poor healing, hernia, or formation of adhesions. Unexpected reactions may occur from any drug or anesthetic given. Unintended injury may occur to other pelvic or abdominal structures such as Fallopian tubes, ovaries, bladder, ureter (tube from kidney to bladder), or bowel. Nerves going from the pelvis to the legs may be injured. Any such injury may require immediate or later additional surgery to correct the problem. Excessive blood loss requiring transfusion is very unlikely but possible. Dangerous blood clots may form in the legs or lungs. Physical and sexual activity will be restricted in varying degrees for an indeterminate period of time but most often 2-6 weeks.  Finally, she understands that it is impossible to list every possible undesirable effect and that the condition for which surgery is done is not always cured or significantly improved, and in rare cases may be even worse.Ample time was given to answer all  questions.  Barnett Applebaum, MD, [facility], [facility] 01/06/2018  8:55 AM

## 2018-01-11 ENCOUNTER — Telehealth: Payer: Self-pay

## 2018-01-11 DIAGNOSIS — I788 Other diseases of capillaries: Secondary | ICD-10-CM | POA: Diagnosis not present

## 2018-01-11 DIAGNOSIS — Z1283 Encounter for screening for malignant neoplasm of skin: Secondary | ICD-10-CM | POA: Diagnosis not present

## 2018-01-11 DIAGNOSIS — D229 Melanocytic nevi, unspecified: Secondary | ICD-10-CM | POA: Diagnosis not present

## 2018-01-11 DIAGNOSIS — L812 Freckles: Secondary | ICD-10-CM | POA: Diagnosis not present

## 2018-01-11 DIAGNOSIS — D485 Neoplasm of uncertain behavior of skin: Secondary | ICD-10-CM | POA: Diagnosis not present

## 2018-01-11 DIAGNOSIS — D2230 Melanocytic nevi of unspecified part of face: Secondary | ICD-10-CM | POA: Diagnosis not present

## 2018-01-11 DIAGNOSIS — D225 Melanocytic nevi of trunk: Secondary | ICD-10-CM | POA: Diagnosis not present

## 2018-01-11 DIAGNOSIS — L578 Other skin changes due to chronic exposure to nonionizing radiation: Secondary | ICD-10-CM | POA: Diagnosis not present

## 2018-01-11 DIAGNOSIS — L821 Other seborrheic keratosis: Secondary | ICD-10-CM | POA: Diagnosis not present

## 2018-01-11 NOTE — Telephone Encounter (Signed)
Pt states that [employer] sent back paperwork saying the continuous dates need to be completed. Please call pt.  [phone]

## 2018-01-12 ENCOUNTER — Telehealth: Payer: Self-pay

## 2018-01-12 ENCOUNTER — Other Ambulatory Visit: Payer: Self-pay

## 2018-01-12 ENCOUNTER — Encounter
Admission: RE | Admit: 2018-01-12 | Discharge: 2018-01-12 | Disposition: A | Discharge status: Home / Self Care | Payer: 59 | Source: Ambulatory Visit | Attending: Obstetrics & Gynecology | Admitting: Obstetrics & Gynecology

## 2018-01-12 HISTORY — DX: Pneumonia, unspecified organism: J18.9

## 2018-01-12 HISTORY — DX: Adverse effect of unspecified anesthetic, initial encounter: T41.45XA

## 2018-01-12 HISTORY — DX: Anemia, unspecified: D64.9

## 2018-01-12 HISTORY — DX: Failed or difficult intubation, initial encounter: T88.4XXA

## 2018-01-12 HISTORY — DX: Other complications of anesthesia, initial encounter: T88.59XA

## 2018-01-12 NOTE — Pre-Procedure Instructions (Signed)
Echocardiogram stress test5/03/2015 Paige Robles Component Name Value Ref Range  LV Ejection Fraction (%) 55   Aortic Valve Regurgitation Grade trivial   Aortic Valve Stenosis Grade none   Mitral Valve Regurgitation Grade trivial   Mitral Valve Stenosis Grade none   Tricuspid Valve Regurgitation Grade mild   Tricuspid Valve Regurgitation Max Velocity (m/s) 2.3 m/sec  Result Narrative  INTERNAL MEDICINE DEPARTMENT BRAYLIE, BADAMI [id number] [facility] Acct #: [id number]  [address], ,[place] [address] Date: 06/03/2015 10:31 AM  Adult Female Age: 52 yrs  ECHOCARDIOGRAM REPORT Outpatient STUDY:Stress EchoTAPE: KC::KCWI  ECHO:Yes DOPPLER:YesFILE:0000-000-000 MD1:KLEIN, BERT JACK COLOR:YesCONTRAST:NoMACHINE:PhilipsHeight: 14 in RV BIOPSY:No 3D:No SOUND QLTY:Moderate Weight: 153 lb  MEDIUM:None  BSA: 1.7 m2 ___________________________________________________________________________________________  HISTORY:Hypertension REASON:Assess, LV function Indication:Edema, peripheral [R60.9 (ICD-10-CM)];, Chest pain at rest, unspecified  [R07.9 (ICD-10-CM)]  ___________________________________________________________________________________________ STRESS ECHOCARDIOGRAPHY   Protocol:Treadmill (Accelerated Bruce) Drugs:None Target Heart Rate:144 bpmMaximum Predicted Heart Rate: 169  bpm  +-------------------+-------------------------+-------------------------+------------+  Stage  Duration (mm:ss) Heart Rate (bpm) BP  +-------------------+-------------------------+-------------------------+------------+ RESTING 85  112/79  +-------------------+-------------------------+-------------------------+------------+ EXERCISE  3:24 146  / +-------------------+-------------------------+-------------------------+------------+ RECOVERY  0:2725  137/86  +-------------------+-------------------------+-------------------------+------------+  Stress Duration:3:24 mm:ss Max Stress H.R.:146 bpmTarget Heart Rate Achieved: Yes Maximum workload of 7.00 METS was achieved during exercise   ___________________________________________________________________________________________ WALL SEGMENT CHANGES  RestStress Anterior Septum Basal:NormalHyperkinetic DGU:[id number]  Apical:NormalHyperkinetic  Anterior Wall Basal:NormalHyperkinetic PPI:[id number]  Apical:NormalHyperkinetic   Lateral Wall Basal:NormalHyperkinetic XNA:[id number]  Apical:NormalHyperkinetic   Posterior Wall Basal:NormalHyperkinetic SEG:[id number]  Inferior Wall Basal:NormalHyperkinetic SWN:[id number]  Apical:NormalHyperkinetic  Inferior Septum Basal:NormalHyperkinetic ZJI:[id number]    Resting EF:>55% (Est.) Stress EF: >55% (Est.)   ___________________________________________________________________________________________ ADDITIONAL FINDINGS    ___________________________________________________________________________________________ STRESS ECG RESULTS   ECG Results:Normal  ___________________________________________________________________________________________  ECHOCARDIOGRAPHIC DESCRIPTIONS  LEFT VENTRICLE Size:Normal  Contraction:Normal  LV Masses:No Masses  IDP:OEUM Dias.FxClass:Normal  RIGHT VENTRICLE Size:Normal Free Wall:Normal  Contraction:Normal RV Masses:No mass  PERICARDIUM  Fluid:No effusion  _______________________________________________________________________________________  DOPPLER ECHO and OTHER SPECIAL PROCEDURES  Aortic:TRIVIAL AR No AS   Mitral:TRIVIAL MR No MS MV Inflow E Vel=nm*MV Annulus E'Vel=nm* E/E'Ratio=nm*  Tricuspid:MILD TRNo TS 232.0 cm/sec peak TR vel  Pulmonary:TRIVIAL PR No PS   ___________________________________________________________________________________________  ECHOCARDIOGRAPHIC MEASUREMENTS 2D DIMENSIONS AORTA ValuesNormal RangeMAIN PAValuesNormal Range Annulus:nm* [2.1 - 2.5]PA Main:nm* [1.5 - 2.1] Aorta Sin:nm* [2.7 - 3.3] RIGHT VENTRICLE ST Junction:nm* [2.3 - 2.9]RV Base:nm* [ < 4.2] Asc.Aorta:nm* [2.3 - 3.1] RV Mid:nm* [ < 3.5]  LEFT VENTRICLERV Length:nm* [ < 8.6] LVIDd:nm* [3.9 - 5.3] INFERIOR VENA  CAVA LVIDs:nm* Max. IVC:nm* [ <= 2.1]  FS:nm* [> 25]Min. IVC:nm* SWT:nm* [0.5 - 0.9] ------------------ PWT:nm* [0.5 - 0.9] nm* - not measured  LEFT ATRIUM LA Diam:nm* [2.7 - 3.8] LA A4C Area:nm* [ < 20] LA Volume:nm* [22 - 35]  ___________________________________________________________________________________________ INTERPRETATION Normal Stress Echocardiogram NORMAL RIGHT VENTRICULAR SYSTOLIC FUNCTION MILD VALVULAR REGURGITATION (See above) NO VALVULAR STENOSIS NOTED   ___________________________________________________________________________________________  Electronically signed by: Rusty Aus, MD on 06/04/2015 05:20 PM Performed By: Scherrie November, RCS Ordering Physician: Paige Robles ___________________________________________________________________________________________  Other Result Information  Interface, Text Results In - 06/04/2015  5:21 PM EDT                 INTERNAL MEDICINE [place], [place]  [id number]                   [address] #: [id number]        [address] [place],  [place] [address]             Date: 06/03/2015 10:31 AM                                                                  Adult Female Age: 52 yrs                    ECHOCARDIOGRAM REPORT                         Outpatient           STUDY:Stress Echo        TAPE:                         KC::KCWI            ECHO:Yes   DOPPLER:Yes  FILE:0000-000-000             MD1:  Paige Robles JACK           COLOR:Yes  CONTRAST:No      MACHINE:Philips            Height: 61 in       RV BIOPSY:No         3D:No   SOUND QLTY:Moderate            Weight: 153 lb          MEDIUM:None                                                                  BSA: 1.7 m2 ___________________________________________________________________________________________    HISTORY:Hypertension     REASON:Assess, LV function Indication:Edema, peripheral [R60.9 (ICD-10-CM)];, Chest pain at rest, unspecified            [R07.9 (ICD-10-CM)]  ___________________________________________________________________________________________ STRESS ECHOCARDIOGRAPHY           Protocol:Treadmill (Accelerated Bruce)             Drugs:None Target Heart Rate:144 bpm        Maximum Predicted Heart Rate: 169 bpm  +-------------------+-------------------------+-------------------------+------------+        Stage            Duration (mm:ss)         Heart Rate (bpm)         BP      +-------------------+-------------------------+-------------------------+------------+       RESTING                                           85  112/79    +-------------------+-------------------------+-------------------------+------------+       EXERCISE                3:24                     146                /       +-------------------+-------------------------+-------------------------+------------+       RECOVERY                3:06                      88              137/86    +-------------------+-------------------------+-------------------------+------------+    Stress Duration:3:24 mm:ss   Max Stress H.R.:146 bpm        Target Heart Rate Achieved: Yes Maximum workload of 7.00 METS was achieved during exercise   ___________________________________________________________________________________________ WALL SEGMENT CHANGES                        Rest          Stress Anterior Septum Basal:Normal        Hyperkinetic                   SEG:BTDVVO        Hyperkinetic                Apical:Normal        Hyperkinetic    Anterior Wall  Basal:Normal        Hyperkinetic                   HYW:VPXTGG        Hyperkinetic                Apical:Normal        Hyperkinetic     Lateral Wall Basal:Normal        Hyperkinetic                   YIR:SWNIOE        Hyperkinetic                Apical:Normal        Hyperkinetic   Posterior Wall Basal:Normal        Hyperkinetic                   VOJ:JKKXFG        Hyperkinetic    Inferior Wall Basal:Normal        Hyperkinetic                   HWE:XHBZJI        Hyperkinetic                Apical:Normal        Hyperkinetic  Inferior Septum Basal:Normal        Hyperkinetic                   RCV:ELFYBO        Hyperkinetic             Resting EF:>55% (Est.)   Stress EF: >55% (Est.)   ___________________________________________________________________________________________ ADDITIONAL FINDINGS    ___________________________________________________________________________________________ STRESS ECG RESULTS  ECG Results:Normal  ___________________________________________________________________________________________  ECHOCARDIOGRAPHIC DESCRIPTIONS  LEFT VENTRICLE         Size:Normal  Contraction:Normal    LV Masses:No Masses          DDU:KGUR Dias.FxClass:Normal  RIGHT VENTRICLE         Size:Normal               Free Wall:Normal  Contraction:Normal               RV Masses:No mass  PERICARDIUM        Fluid:No effusion  _______________________________________________________________________________________  DOPPLER ECHO and OTHER SPECIAL PROCEDURES    Aortic:TRIVIAL AR                 No AS     Mitral:TRIVIAL MR                 No MS           MV Inflow E Vel=nm*        MV Annulus E'Vel=nm*           E/E'Ratio=nm*  Tricuspid:MILD TR                    No TS           232.0 cm/sec peak TR vel  Pulmonary:TRIVIAL PR                 No  PS   ___________________________________________________________________________________________  ECHOCARDIOGRAPHIC MEASUREMENTS 2D DIMENSIONS AORTA             Values      Normal Range      MAIN PA          Values      Normal Range           Annulus:  nm*       [2.1 - 2.5]                PA Main:  nm*       [1.5 - 2.1]         Aorta Sin:  nm*       [2.7 - 3.3]       RIGHT VENTRICLE       ST Junction:  nm*       [2.3 - 2.9]                RV Base:  nm*       [ < 4.2]         Asc.Aorta:  nm*       [2.3 - 3.1]                 RV Mid:  nm*       [ < 3.5]  LEFT VENTRICLE                                        RV Length:  nm*       [ < 8.6]             LVIDd:  nm*       [3.9 - 5.3]       INFERIOR VENA CAVA             LVIDs:  nm*  Max. IVC:  nm*       [ <= 2.1]                FS:  nm*       [> 25]                    Min. IVC:  nm*               SWT:  nm*       [0.5 - 0.9]                   ------------------               PWT:  nm*       [0.5 - 0.9]                   nm* - not measured  LEFT ATRIUM           LA Diam:  nm*       [2.7 - 3.8]       LA A4C Area:  nm*       [ < 20]         LA Volume:  nm*       [22 - 52]  ___________________________________________________________________________________________ INTERPRETATION Normal Stress Echocardiogram NORMAL RIGHT VENTRICULAR SYSTOLIC FUNCTION MILD VALVULAR REGURGITATION (See above) NO VALVULAR STENOSIS NOTED   ___________________________________________________________________________________________  Electronically signed by: Rusty Aus, MD on 06/04/2015 05:20 PM             Performed By: Scherrie November, RCS       Ordering Physician: Paige Robles ___________________________________________________________________________________________  Status Results Details

## 2018-01-12 NOTE — Patient Instructions (Addendum)
Your procedure is scheduled on: 01-19-18 THURSDAY Report to Same Day Surgery 2nd floor medical mall [facility] Entrance-take elevator on left to 2nd floor.  Check in with surgery information desk.) To find out your arrival time please call [phone] between 1PM - 3PM on 01-18-18 [facility]  Remember: Instructions that are not followed completely may result in serious medical risk, up to and including death, or upon the discretion of your surgeon and anesthesiologist your surgery may need to be rescheduled.    _x___ 1. Do not eat food after midnight the night before your procedure. You may drink clear liquids up to 2 hours before you are scheduled to arrive at the hospital for your procedure.  NO GUM OR CANDY AFTER MIDNIGHT.Do not drink clear liquids within 2 hours of your scheduled arrival to the hospital.  Clear liquids include  --Water or Apple juice without pulp  --Clear carbohydrate beverage such as ClearFast or Gatorade  --Black Coffee or Clear Tea (No milk, no creamers, do not add anything to the coffee or Tea Type 1 and type 2 diabetics should only drink water.   ____Ensure clear carbohydrate drink on the way to the hospital for bariatric patients  ____Ensure clear carbohydrate drink 3 hours before surgery for Dr Dwyane Luo patients if physician instructed.    __x__ 2. No Alcohol for 24 hours before or after surgery.   __x__3. No Smoking or e-cigarettes for 24 prior to surgery.  Do not use any chewable tobacco products for at least 6 hour prior to surgery   ____  4. Bring all medications with you on the day of surgery if instructed.    __x__ 5. Notify your doctor if there is any change in your medical condition     (cold, fever, infections).    x___6. On the morning of surgery brush your teeth with toothpaste and water.  You may rinse your mouth with mouth wash if you wish.  Do not swallow any toothpaste or mouthwash.   Do not wear jewelry, make-up, hairpins, clips or  nail polish.  Do not wear lotions, powders, or perfumes. You may wear deodorant.  Do not shave 48 hours prior to surgery. Men may shave face and neck.  Do not bring valuables to the hospital.    [facility] is not responsible for any belongings or valuables.               Contacts, dentures or bridgework may not be worn into surgery.  Leave your suitcase in the car. After surgery it may be brought to your room.  For patients admitted to the hospital, discharge time is determined by your                       treatment team.  _  Patients discharged the day of surgery will not be allowed to drive home.  You will need someone to drive you home and stay with you the night of your procedure.    Please read over the following fact sheets that you were given:   [facility] Preparing for Surgery   _x___ TAKE THE FOLLOWING MEDICATION THE MORNING OF SURGERY WITH A SMALL SIP OF WATER. These include:  1. CYMBALTA  2. [place]  3. ALLEGRA  4.  5.  6.  ____Fleets enema or Magnesium Citrate as directed.   _x___ Use CHG Soap or sage wipes as directed on instruction sheet   ____ Use inhalers on the day of surgery  and bring to hospital day of surgery  ____ Stop Metformin and Janumet 2 days prior to surgery.    ____ Take 1/2 of usual insulin dose the night before surgery and none on the morning surgery.   ____ Follow recommendations from Cardiologist, Pulmonologist or PCP regarding stopping Aspirin, Coumadin, Plavix ,Eliquis, Effient, or Pradaxa, and Pletal.  X____Stop Anti-inflammatories such as Advil, Aleve, Ibuprofen, Motrin, Naproxen, Naprosyn, Goodies powders or aspirin products NOW- OK to take Tylenol    ____ Stop supplements until after surgery.    ____ Bring C-Pap to the hospital.

## 2018-01-12 NOTE — Telephone Encounter (Signed)
FMLA/DISABILITY form for [employer] filled out, signature obtained and given to [place] for processing.

## 2018-01-13 ENCOUNTER — Encounter
Admission: RE | Admit: 2018-01-13 | Discharge: 2018-01-13 | Disposition: A | Discharge status: Home / Self Care | Payer: 59 | Source: Ambulatory Visit | Attending: Obstetrics & Gynecology | Admitting: Obstetrics & Gynecology

## 2018-01-13 DIAGNOSIS — Z01818 Encounter for other preprocedural examination: Secondary | ICD-10-CM | POA: Insufficient documentation

## 2018-01-13 DIAGNOSIS — Z01810 Encounter for preprocedural cardiovascular examination: Secondary | ICD-10-CM | POA: Diagnosis not present

## 2018-01-13 LAB — CBC
HCT: 35 % — ABNORMAL LOW (ref 36.0–46.0)
Hemoglobin: 11.1 g/dL — ABNORMAL LOW (ref 12.0–15.0)
MCH: 27.7 pg (ref 26.0–34.0)
MCHC: 31.7 g/dL (ref 30.0–36.0)
MCV: 87.3 fL (ref 80.0–100.0)
NRBC: 0 % (ref 0.0–0.2)
Platelets: 399 10*3/uL (ref 150–400)
RBC: 4.01 MIL/uL (ref 3.87–5.11)
RDW: 13.5 % (ref 11.5–15.5)
WBC: 6.8 10*3/uL (ref 4.0–10.5)

## 2018-01-13 LAB — TYPE AND SCREEN
ABO/RH(D): O POS
Antibody Screen: NEGATIVE

## 2018-01-13 LAB — PROTIME-INR
INR: 0.95
Prothrombin Time: 12.6 seconds (ref 11.4–15.2)

## 2018-01-13 LAB — APTT: aPTT: 34 seconds (ref 24–36)

## 2018-01-17 NOTE — Pre-Procedure Instructions (Signed)
CARDIOLOGY CONFIRMED EKG READING FROM 01-13-18 AND NOW SHOWS ST ELEVATION.  DR OVFIEPP REVIEWED EKG FROM 01-13-18 AND FROM 2016 AND WANTS MEDICAL CLEARANCE. FAXED THIS TO DR [place]. LEFT MESSAGE FOR NANCY AT [facility] STATING THAT PT NEEDS CLEARANCE AND THIS WAS SENT TO BOTH OFFICES WITH CONFIRMATION RECEIVED.  INFORMED NANCY SHE NEEDS TO FOLLOW UP WITH CLEARANCE IN AM

## 2018-01-18 DIAGNOSIS — Z01810 Encounter for preprocedural cardiovascular examination: Secondary | ICD-10-CM | POA: Diagnosis not present

## 2018-01-18 DIAGNOSIS — R0602 Shortness of breath: Secondary | ICD-10-CM | POA: Diagnosis not present

## 2018-01-18 DIAGNOSIS — R079 Chest pain, unspecified: Secondary | ICD-10-CM | POA: Diagnosis not present

## 2018-01-18 DIAGNOSIS — R9431 Abnormal electrocardiogram [ECG] [EKG]: Secondary | ICD-10-CM | POA: Diagnosis not present

## 2018-01-18 NOTE — Pre-Procedure Instructions (Signed)
BY NANCY AT [facility] PATIENT SEEING DR PARASCHOS AT 2 PM TODAY. REQUESTED CLEARANCE OR NOT BE FAXED TO [facility]

## 2018-01-19 ENCOUNTER — Ambulatory Visit
Admission: RE | Admit: 2018-01-19 | Discharge: 2018-01-19 | Disposition: A | Discharge status: Home / Self Care | Payer: 59 | Attending: Obstetrics & Gynecology | Admitting: Obstetrics & Gynecology

## 2018-01-19 ENCOUNTER — Encounter
Admission: RE | Disposition: A | Discharge status: Home / Self Care | Payer: Self-pay | Source: Home / Self Care | Attending: Obstetrics & Gynecology

## 2018-01-19 ENCOUNTER — Other Ambulatory Visit: Payer: Self-pay

## 2018-01-19 ENCOUNTER — Encounter: Payer: Self-pay | Admitting: *Deleted

## 2018-01-19 ENCOUNTER — Ambulatory Visit: Payer: 59 | Admitting: Certified Registered"

## 2018-01-19 DIAGNOSIS — Z79899 Other long term (current) drug therapy: Secondary | ICD-10-CM | POA: Diagnosis not present

## 2018-01-19 DIAGNOSIS — K219 Gastro-esophageal reflux disease without esophagitis: Secondary | ICD-10-CM | POA: Insufficient documentation

## 2018-01-19 DIAGNOSIS — R1031 Right lower quadrant pain: Secondary | ICD-10-CM | POA: Diagnosis present

## 2018-01-19 DIAGNOSIS — N838 Other noninflammatory disorders of ovary, fallopian tube and broad ligament: Secondary | ICD-10-CM | POA: Insufficient documentation

## 2018-01-19 DIAGNOSIS — K660 Peritoneal adhesions (postprocedural) (postinfection): Secondary | ICD-10-CM | POA: Diagnosis not present

## 2018-01-19 DIAGNOSIS — N809 Endometriosis, unspecified: Secondary | ICD-10-CM | POA: Diagnosis present

## 2018-01-19 HISTORY — PX: LAPAROSCOPIC LYSIS OF ADHESIONS: SHX5905

## 2018-01-19 HISTORY — PX: LAPAROSCOPIC BILATERAL SALPINGO OOPHERECTOMY: SHX5890

## 2018-01-19 LAB — ABO/RH: ABO/RH(D): O POS

## 2018-01-19 SURGERY — SALPINGO-OOPHORECTOMY, BILATERAL, LAPAROSCOPIC
Anesthesia: General

## 2018-01-19 MED ORDER — HYDROMORPHONE HCL 1 MG/ML IJ SOLN
0.2500 mg | INTRAMUSCULAR | Status: DC | PRN
  Administered 2018-01-19 (×4): 0.5 mg via INTRAVENOUS

## 2018-01-19 MED ORDER — HYDROMORPHONE HCL 1 MG/ML IJ SOLN
INTRAMUSCULAR | Status: AC
  Administered 2018-01-19: 0.5 mg via INTRAVENOUS
  Filled 2018-01-19: qty 1

## 2018-01-19 MED ORDER — ONDANSETRON HCL 4 MG/2ML IJ SOLN: 4.0000 mg | Freq: Once | INTRAMUSCULAR | Status: DC | PRN

## 2018-01-19 MED ORDER — FENTANYL CITRATE (PF) 100 MCG/2ML IJ SOLN
25.0000 ug | INTRAMUSCULAR | Status: AC | PRN
  Administered 2018-01-19 (×6): 25 ug via INTRAVENOUS

## 2018-01-19 MED ORDER — OXYCODONE-ACETAMINOPHEN 5-325 MG PO TABS
ORAL_TABLET | ORAL | Status: AC
  Administered 2018-01-19: 1 via ORAL
  Filled 2018-01-19: qty 1

## 2018-01-19 MED ORDER — LACTATED RINGERS IV SOLN
Freq: Once | INTRAVENOUS | Status: AC
  Administered 2018-01-19: 16:00:00 via INTRAVENOUS

## 2018-01-19 MED ORDER — ROCURONIUM BROMIDE 100 MG/10ML IV SOLN
INTRAVENOUS | Status: DC | PRN
  Administered 2018-01-19: 15 mg via INTRAVENOUS
  Administered 2018-01-19: 10 mg via INTRAVENOUS

## 2018-01-19 MED ORDER — BUPIVACAINE HCL (PF) 0.5 % IJ SOLN
INTRAMUSCULAR | Status: AC
  Filled 2018-01-19: qty 30

## 2018-01-19 MED ORDER — LACTATED RINGERS IV SOLN: INTRAVENOUS | Status: DC

## 2018-01-19 MED ORDER — SUCCINYLCHOLINE CHLORIDE 20 MG/ML IJ SOLN
INTRAMUSCULAR | Status: DC | PRN
  Administered 2018-01-19: 80 mg via INTRAVENOUS

## 2018-01-19 MED ORDER — PROPOFOL 10 MG/ML IV BOLUS
INTRAVENOUS | Status: DC | PRN
  Administered 2018-01-19: 130 mg via INTRAVENOUS
  Administered 2018-01-19 (×2): 50 mg via INTRAVENOUS
  Administered 2018-01-19: 70 mg via INTRAVENOUS

## 2018-01-19 MED ORDER — BUPIVACAINE HCL (PF) 0.5 % IJ SOLN
INTRAMUSCULAR | Status: DC | PRN
  Administered 2018-01-19: 14 mL

## 2018-01-19 MED ORDER — PROPOFOL 10 MG/ML IV BOLUS
INTRAVENOUS | Status: AC
  Filled 2018-01-19: qty 20

## 2018-01-19 MED ORDER — FENTANYL CITRATE (PF) 100 MCG/2ML IJ SOLN
INTRAMUSCULAR | Status: AC
  Administered 2018-01-19: 25 ug via INTRAVENOUS
  Filled 2018-01-19: qty 2

## 2018-01-19 MED ORDER — OXYCODONE-ACETAMINOPHEN 5-325 MG PO TABS: 1.0000 | ORAL_TABLET | ORAL | 0 refills | Status: DC | PRN

## 2018-01-19 MED ORDER — FENTANYL CITRATE (PF) 100 MCG/2ML IJ SOLN
INTRAMUSCULAR | Status: AC
  Filled 2018-01-19: qty 2

## 2018-01-19 MED ORDER — ACETAMINOPHEN 325 MG PO TABS: 650.0000 mg | ORAL_TABLET | ORAL | Status: DC | PRN

## 2018-01-19 MED ORDER — SUGAMMADEX SODIUM 200 MG/2ML IV SOLN
INTRAVENOUS | Status: DC | PRN
  Administered 2018-01-19: 200 mg via INTRAVENOUS

## 2018-01-19 MED ORDER — FENTANYL CITRATE (PF) 100 MCG/2ML IJ SOLN
INTRAMUSCULAR | Status: DC | PRN
  Administered 2018-01-19 (×2): 50 ug via INTRAVENOUS

## 2018-01-19 MED ORDER — LACTATED RINGERS IV SOLN
INTRAVENOUS | Status: DC
  Administered 2018-01-19: 17:00:00 via INTRAVENOUS

## 2018-01-19 MED ORDER — OXYCODONE-ACETAMINOPHEN 5-325 MG PO TABS
1.0000 | ORAL_TABLET | ORAL | Status: DC | PRN
  Administered 2018-01-19: 1 via ORAL

## 2018-01-19 MED ORDER — LIDOCAINE HCL (CARDIAC) PF 100 MG/5ML IV SOSY
PREFILLED_SYRINGE | INTRAVENOUS | Status: DC | PRN
  Administered 2018-01-19: 80 mg via INTRAVENOUS

## 2018-01-19 MED ORDER — DEXAMETHASONE SODIUM PHOSPHATE 10 MG/ML IJ SOLN
INTRAMUSCULAR | Status: DC | PRN
  Administered 2018-01-19: 20 mg via INTRAVENOUS

## 2018-01-19 MED ORDER — ONDANSETRON HCL 4 MG/2ML IJ SOLN
INTRAMUSCULAR | Status: DC | PRN
  Administered 2018-01-19: 4 mg via INTRAVENOUS

## 2018-01-19 MED ORDER — MORPHINE SULFATE (PF) 4 MG/ML IV SOLN: 1.0000 mg | INTRAVENOUS | Status: DC | PRN

## 2018-01-19 MED ORDER — ACETAMINOPHEN 650 MG RE SUPP: 650.0000 mg | RECTAL | Status: DC | PRN

## 2018-01-19 MED ORDER — KETOROLAC TROMETHAMINE 30 MG/ML IJ SOLN
30.0000 mg | Freq: Four times a day (QID) | INTRAMUSCULAR | Status: DC
  Filled 2018-01-19: qty 1

## 2018-01-19 MED ORDER — PHENYLEPHRINE HCL 10 MG/ML IJ SOLN
INTRAMUSCULAR | Status: DC | PRN
  Administered 2018-01-19 (×3): 100 ug via INTRAVENOUS

## 2018-01-19 SURGICAL SUPPLY — 39 items
BLADE SURG SZ11 CARB STEEL (BLADE) ×3 IMPLANT
CANISTER SUCT 1200ML W/VALVE (MISCELLANEOUS) ×3 IMPLANT
CATH ROBINSON RED A/P 16FR (CATHETERS) ×3 IMPLANT
CHLORAPREP W/TINT 26ML (MISCELLANEOUS) ×3 IMPLANT
COVER WAND RF STERILE (DRAPES) IMPLANT
DERMABOND ADVANCED (GAUZE/BANDAGES/DRESSINGS) ×1
DERMABOND ADVANCED .7 DNX12 (GAUZE/BANDAGES/DRESSINGS) ×2 IMPLANT
DRSG TELFA 4X3 1S NADH ST (GAUZE/BANDAGES/DRESSINGS) IMPLANT
GLOVE BIO SURGEON STRL SZ8 (GLOVE) ×3 IMPLANT
GLOVE INDICATOR 8.0 STRL GRN (GLOVE) ×3 IMPLANT
GOWN STRL REUS W/ TWL LRG LVL3 (GOWN DISPOSABLE) ×2 IMPLANT
GOWN STRL REUS W/ TWL XL LVL3 (GOWN DISPOSABLE) ×2 IMPLANT
GOWN STRL REUS W/TWL LRG LVL3 (GOWN DISPOSABLE) ×1
GOWN STRL REUS W/TWL XL LVL3 (GOWN DISPOSABLE) ×1
GRASPER SUT TROCAR 14GX15 (MISCELLANEOUS) ×3 IMPLANT
IRRIGATION STRYKERFLOW (MISCELLANEOUS) IMPLANT
IRRIGATOR STRYKERFLOW (MISCELLANEOUS)
IV LACTATED RINGERS 1000ML (IV SOLUTION) IMPLANT
KIT PINK PAD W/HEAD ARE REST (MISCELLANEOUS) ×3
KIT PINK PAD W/HEAD ARM REST (MISCELLANEOUS) ×2 IMPLANT
LABEL OR SOLS (LABEL) ×3 IMPLANT
NEEDLE VERESS 14GA 120MM (NEEDLE) ×3 IMPLANT
NS IRRIG 500ML POUR BTL (IV SOLUTION) ×3 IMPLANT
PACK GYN LAPAROSCOPIC (MISCELLANEOUS) ×3 IMPLANT
PAD PREP 24X41 OB/GYN DISP (PERSONAL CARE ITEMS) ×3 IMPLANT
POUCH SPECIMEN RETRIEVAL 10MM (ENDOMECHANICALS) ×3 IMPLANT
SCISSORS METZENBAUM CVD 33 (INSTRUMENTS) ×3 IMPLANT
SHEARS HARMONIC ACE PLUS 36CM (ENDOMECHANICALS) ×3 IMPLANT
SLEEVE ENDOPATH XCEL 5M (ENDOMECHANICALS) IMPLANT
SPONGE GAUZE 2X2 8PLY STRL LF (GAUZE/BANDAGES/DRESSINGS) IMPLANT
STRAP SAFETY 5IN WIDE (MISCELLANEOUS) ×3 IMPLANT
SUT VIC AB 0 CT1 36 (SUTURE) ×3 IMPLANT
SUT VIC AB 2-0 UR6 27 (SUTURE) IMPLANT
SUT VIC AB 4-0 PS2 18 (SUTURE) IMPLANT
SYR 10ML LL (SYRINGE) ×3 IMPLANT
SYSTEM WECK SHIELD CLOSURE (TROCAR) ×3 IMPLANT
TROCAR ENDO BLADELESS 11MM (ENDOMECHANICALS) IMPLANT
TROCAR XCEL NON-BLD 5MMX100MML (ENDOMECHANICALS) ×3 IMPLANT
TUBING INSUFFLATION (TUBING) ×3 IMPLANT

## 2018-01-19 NOTE — Anesthesia Preprocedure Evaluation (Addendum)
Anesthesia Evaluation  Patient identified by MRN, date of birth, ID band Patient awake    Reviewed: Allergy & Precautions, NPO status , Patient's Chart, lab work & pertinent test results  History of Anesthesia Complications (+) DIFFICULT AIRWAY and history of anesthetic complications  Airway Mallampati: IV  TM Distance: <3 FB   Mouth opening: Limited Mouth Opening Comment: TM distance about 2 FB , with small mouth Dental  (+) Teeth Intact   Pulmonary    Pulmonary exam normal        Cardiovascular negative cardio ROS Normal cardiovascular exam     Neuro/Psych negative neurological ROS  negative psych ROS   GI/Hepatic Neg liver ROS, GERD  Medicated,  Endo/Other  negative endocrine ROS  Renal/GU negative Renal ROS  Female GU complaint     Musculoskeletal negative musculoskeletal ROS (+)   Abdominal Normal abdominal exam  (+)   Peds negative pediatric ROS (+)  Hematology  (+) anemia ,   Anesthesia Other Findings   Reproductive/Obstetrics                            Anesthesia Physical Anesthesia Plan  ASA: II  Anesthesia Plan: General   Post-op Pain Management:    Induction: Intravenous, Rapid sequence and Cricoid pressure planned  PONV Risk Score and Plan:   Airway Management Planned: Oral ETT  Additional Equipment:   Intra-op Plan:   Post-operative Plan: Extubation in OR  Informed Consent: I have reviewed the patients History and Physical, chart, labs and discussed the procedure including the risks, benefits and alternatives for the proposed anesthesia with the patient or authorized representative who has indicated his/her understanding and acceptance.   Dental advisory given  Plan Discussed with: CRNA  Anesthesia Plan Comments:         Anesthesia Quick Evaluation

## 2018-01-19 NOTE — Op Note (Signed)
Operative Note:  PRE-OP DIAGNOSIS: RIGHT LOWER QUADRANT PAIN,ENDOMETRIOSIS   POST-OP DIAGNOSIS: RIGHT LOWER QUADRANT PAIN,ENDOMETRIOSIS   PROCEDURE: Procedure(s): LAPAROSCOPIC BILATERAL SALPINGO OOPHORECTOMY LAPAROSCOPIC LYSIS OF ADHESIONS  SURGEON: Barnett Applebaum, MD, FACOG  ANESTHESIA: General endotracheal anesthesia  ESTIMATED BLOOD LOSS: Minimal  SPECIMENS: Tubes and Ovaries.  COMPLICATIONS: None  DISPOSITION: stable to PACU  FINDINGS: Intraabdominal adhesions were noted. Ovaries small and adhesions noted w omentum to anterior abdominal wall.    PROCEDURE IN DETAIL: The patient was taken to the OR where anesthesia was administed. The patient was positioned supine. Foley placed.  Attention was turned to the patient's abdomen where a 5 mm skin incision was made in the umbilical fold, after injection of local anesthesia. The Veress step needle was carefully introduced into the peritoneal cavity with placement confirmed using the hanging drop technique. Pneumoperitoneum was obtained. The 5 mm port was then placed under direct visualization with the operative laparoscope The above noted findings. Trendelenburg.  An 11 mm trocar was then placed in the RLQ lateral to the inferior epigastric blood vessels under direct visualization with the laparoscope, as well as a 5 mm suprapubic trocar.  Dissection of adhesion was performed to aid in visualization of the adnexa. Right and left fallopian tubes/ovaries are identified and the ovarian blood vessels/infundibulopelvic ligament pedicles are coagulated and ligated using the Harmonic scapel. Dissection is carried out to the uterine-ovarian blood vessels which also are transected for amputation of each adnexa.They are removed through a specimen bag. No injuries or bleeding was noted.  Fascia at RLQ incision is closed with a 0 vicryl suture.  All instruments and ports were then removed from the abdomen after gas was expelled and patient was  leveled. The skin was closed with skin adhesive. The foley catheter was removed. The patient tolerated the procedure well. All counts were correct x 2. The patient was transferred to the recovery room awake, alert and breathing independently.  Barnett Applebaum, MD, [facility], [facility] 01/19/2018  5:36 PM

## 2018-01-19 NOTE — Transfer of Care (Signed)
Immediate Anesthesia Transfer of Care Note  Patient: Paige Robles  Procedure(s) Performed: LAPAROSCOPIC BILATERAL SALPINGO OOPHORECTOMY (N/A ) LAPAROSCOPIC LYSIS OF ADHESIONS  Patient Location: PACU  Anesthesia Type:General  Level of Consciousness: awake, alert  and oriented  Airway & Oxygen Therapy: Patient Spontanous Breathing and Patient connected to nasal cannula oxygen  Post-op Assessment: Report given to RN and Post -op Vital signs reviewed and stable  Post vital signs: Reviewed and stable  Last Vitals:  Vitals Value Taken Time  BP    Temp    Pulse    Resp    SpO2      Last Pain:  Vitals:   01/19/18 1448  TempSrc: Temporal  PainSc: 0-No pain         Complications: No apparent anesthesia complications

## 2018-01-19 NOTE — Interval H&P Note (Signed)
History and Physical Interval Note:  01/19/2018 4:17 PM  Paige Robles  has presented today for surgery, with the diagnosis of RIGHT LOWER QUADRANT PAIN,ENDOMETRIOSIS  The various methods of treatment have been discussed with the patient and family. After consideration of risks, benefits and other options for treatment, the patient has consented to  Procedure(s): LAPAROSCOPIC BILATERAL SALPINGO OOPHORECTOMY (N/A) as a surgical intervention .  The patient's history has been reviewed, patient examined, no change in status, stable for surgery.  I have reviewed the patient's chart and labs.  Questions were answered to the patient's satisfaction.     Hoyt Koch

## 2018-01-19 NOTE — Discharge Instructions (Signed)
Bilateral Salpingo-Oophorectomy, Care After °This sheet gives you information about how to care for yourself after your procedure. Your health care provider may also give you more specific instructions. If you have problems or questions, contact your health care provider. °What can I expect after the procedure? °After the procedure, it is common to have: °· Abdominal pain. °· Some occasional vaginal bleeding (spotting). °· Tiredness. °· Symptoms of menopause, such as hot flashes, night sweats, or mood swings. °Follow these instructions at home: °Incision care ° °· Keep your incision area and your bandage (dressing) clean and dry. °· Follow instructions from your health care provider about how to take care of your incision. Make sure you: °? Wash your hands with soap and water before you change your dressing. If soap and water are not available, use hand sanitizer. °? Change your dressing as told by your health care provider. °? Leave stitches (sutures), staples, skin glue, or adhesive strips in place. These skin closures may need to stay in place for 2 weeks or longer. If adhesive strip edges start to loosen and curl up, you may trim the loose edges. Do not remove adhesive strips completely unless your health care provider tells you to do that. °· Check your incision area every day for signs of infection. Check for: °? Redness, swelling, or pain. °? Fluid or blood. °? Warmth. °? Pus or a bad smell. °Activity °· Do not drive or use heavy machinery while taking prescription pain medicine. °· Do not drive for 24 hours if you received a medicine to help you relax (sedative) during your procedure. °· Take frequent, short walks throughout the day. Rest when you get tired. Ask your health care provider what activities are safe for you. °· Avoid activity that requires great effort. Also, avoid heavy lifting. Do not lift anything that is heavier than 10 lbs. (4.5 kg), or the limit that your health care provider tells you,  until he or she says that it is safe to do so. °· Do not douche, use tampons, or have sex until your health care provider approves. °General instructions °· To prevent or treat constipation while you are taking prescription pain medicine, your health care provider may recommend that you: °? Drink enough fluid to keep your urine clear or pale yellow. °? Take over-the-counter or prescription medicines. °? Eat foods that are high in fiber, such as fresh fruits and vegetables, whole grains, and beans. °? Limit foods that are high in fat and processed sugars, such as fried and sweet foods. °· Take over-the-counter and prescription medicines only as told by your health care provider. °· Do not take baths, swim, or use a hot tub until your health care provider approves. Ask your health care provider if you can take showers. You may only be allowed to take sponge baths for bathing. °· Wear compression stockings as told by your health care provider. These stockings help to prevent blood clots and reduce swelling in your legs. °· Keep all follow-up visits as told by your health care provider. This is important. °Contact a health care provider if: °· You have pain when you urinate. °· You have pus or a bad smelling discharge coming from your vagina. °· You have redness, swelling, or pain around your incision. °· You have fluid or blood coming from your incision. °· Your incision feels warm to the touch. °· You have pus or a bad smell coming from your incision. °· You have a fever. °· Your incision   starts to break open. °· You have pain in the abdomen, and it gets worse or does not get better when you take medicine. °· You develop a rash. °· You develop nausea and vomiting. °· You feel lightheaded. °Get help right away if: °· You develop pain in your chest or leg. °· You become short of breath. °· You faint. °· You have increased bleeding from your vagina. °Summary °· After the procedure, it is common to have pain, bleeding in  the vagina, and symptoms of menopause. °· Follow instructions from your health care provider about how to take care of your incision. °· Follow instructions from your health care provider about activities and restrictions. °· Check your incision every day for signs of infection and report any symptoms to your health care provider. °This information is not intended to replace advice given to you by your health care provider. Make sure you discuss any questions you have with your health care provider. °Document Released: 01/18/2005 Document Revised: 04/29/2016 Document Reviewed: 02/23/2016 °Elsevier Interactive Patient Education © 2019 [employer] ° °

## 2018-01-19 NOTE — Anesthesia Post-op Follow-up Note (Signed)
Anesthesia QCDR form completed.        

## 2018-01-19 NOTE — Anesthesia Procedure Notes (Signed)
Procedure Name: Intubation Date/Time: 01/19/2018 5:00 PM Performed by: Chanetta Marshall, CRNA Pre-anesthesia Checklist: Patient identified, Emergency Drugs available, Suction available and Patient being monitored Patient Re-evaluated:Patient Re-evaluated prior to induction Oxygen Delivery Method: Circle system utilized Preoxygenation: Pre-oxygenation with 100% oxygen Induction Type: IV induction Ventilation: Mask ventilation without difficulty Laryngoscope Size: Glidescope, McGraph, 3 and Miller Grade View: Grade IV Tube size: 7.5 mm Number of attempts: 3 (Known h/o difficult intubation.  1st attempt w/ McGrath grade 4 view, 2nd attempt w/ Mil 2 w/ no improvement, 3rd attempt w/ glidescope and grade II view able to pass ETT via bougie under view from Glidescope.   In future recommmend glidecope or FOI. ) Airway Equipment and Method: Stylet and Video-laryngoscopy Placement Confirmation: ETT inserted through vocal cords under direct vision,  positive ETCO2,  CO2 detector and breath sounds checked- equal and bilateral Secured at: 21 cm Tube secured with: Tape Dental Injury: Teeth and Oropharynx as per pre-operative assessment  Difficulty Due To: Difficulty was anticipated

## 2018-01-20 ENCOUNTER — Encounter: Payer: Self-pay | Admitting: Obstetrics & Gynecology

## 2018-01-20 NOTE — Addendum Note (Signed)
Addendum  created 01/20/18 1205 by Alvin Critchley, MD   Clinical Note Signed

## 2018-01-20 NOTE — Anesthesia Postprocedure Evaluation (Addendum)
Anesthesia Post Note  Patient: Emilyann Banka  Procedure(s) Performed: LAPAROSCOPIC BILATERAL SALPINGO OOPHORECTOMY (N/A ) LAPAROSCOPIC LYSIS OF ADHESIONS  Patient location during evaluation: PACU Anesthesia Type: General Level of consciousness: awake and alert Pain management: pain level controlled Vital Signs Assessment: post-procedure vital signs reviewed and stable Respiratory status: spontaneous breathing, nonlabored ventilation, respiratory function stable and patient connected to nasal cannula oxygen Cardiovascular status: blood pressure returned to baseline and stable Postop Assessment: no apparent nausea or vomiting Anesthetic complications: no     Last Vitals:  Vitals:   01/19/18 1859 01/19/18 1941  BP: 120/67 114/62  Pulse: 89 85  Resp: 16 18  Temp: 36.5 C 36.6 C  SpO2: 97% 98%    Last Pain:  Vitals:   01/19/18 1941  TempSrc: Oral  PainSc: 5    Very anterior airway only visualized with glidescope and accessed with bougie.              Martha Clan

## 2018-01-23 ENCOUNTER — Telehealth: Payer: Self-pay

## 2018-01-23 LAB — SURGICAL PATHOLOGY

## 2018-01-23 NOTE — Telephone Encounter (Signed)
Pt had surg Thurs of last week and now has severe itching.  Thinks it's may be an allergy to the surgical glue.  What to do?  [phone]

## 2018-01-23 NOTE — Telephone Encounter (Signed)
If not benedryl then lotion for itching

## 2018-01-23 NOTE — Telephone Encounter (Signed)
Pt states her ABD area is itching not just the incision , she took her pain meds last night she is trying to come off them. She can not take benadryl, she states the glue is still on her incision.

## 2018-01-23 NOTE — Telephone Encounter (Signed)
Pt will try benadryl cream, ice packs/cool compresses, avoid hot shower/letting area get hot. Also if cannot tolerate benadryl cream to use lotion for itching like calamine.

## 2018-01-30 NOTE — Telephone Encounter (Signed)
Appt to check

## 2018-01-30 NOTE — Telephone Encounter (Signed)
Pt calling again regarding severe itching.  She had tried the cortisone cream, benadryl cream (cannot take oral benadryl) cold packs.  The rash has spreal to whole abdominal area.  It's been a week and a half and she is still ready to claw her skin off.  [phone]

## 2018-01-31 NOTE — Telephone Encounter (Signed)
Called and left voice mail for patient to call back to be schedule °

## 2018-02-02 NOTE — Telephone Encounter (Signed)
Pt is scheduled for tomorrow and on the 8th - both c PH.

## 2018-02-03 ENCOUNTER — Ambulatory Visit (INDEPENDENT_AMBULATORY_CARE_PROVIDER_SITE_OTHER): Payer: 59 | Admitting: Obstetrics & Gynecology

## 2018-02-03 ENCOUNTER — Encounter: Payer: Self-pay | Admitting: Obstetrics & Gynecology

## 2018-02-03 VITALS — BP 100/60 | Ht 60.0 in | Wt 157.0 lb

## 2018-02-03 DIAGNOSIS — R21 Rash and other nonspecific skin eruption: Secondary | ICD-10-CM

## 2018-02-03 DIAGNOSIS — R1084 Generalized abdominal pain: Secondary | ICD-10-CM

## 2018-02-03 NOTE — Progress Notes (Signed)
  Postoperative Follow-up Patient presents post op from bilateral oophorectomy for pain and history of endometriosis, 2 weeks ago. Pathology: DIAGNOSIS:  A. OVARY AND FALLOPIAN TUBE, LEFT; SALPINGO-OOPHORECTOMY:  - ATROPHIC OVARIAN TISSUE WITH FIBROUS ADHESIONS.  - UNREMARKABLE FALLOPIAN TUBE.  - NEGATIVE FOR MALIGNANCY.   B. OVARY AND FALLOPIAN TUBE, RIGHT; SALPINGO-OOPHORECTOMY:  - ATROPHIC OVARIAN TISSUE WITH FIBROUS ADHESIONS.  - FALLOPIAN TUBE WITH BENIGN PARATUBAL CYST.  - NEGATIVE FOR MALIGNANCY.  Images:      Subjective: Patient reports marked improvement in her preop symptoms. Eating a regular diet without difficulty. The patient is not having any pain.  Activity: normal activities of daily living. Patient reports additional symptom's since surgery of itching and rash on abdomen (in area of prep) that is improved today.  Objective: BP 100/60   Ht 5' (1.524 m)   Wt 157 lb (71.2 kg)   BMI 30.66 kg/m  OBGyn Exam  Assessment: s/p :  bilateral oophorectomy and laparoscopy progressing well  Plan: Patient has done well after surgery with no apparent complications.  I have discussed the post-operative course to date, and the expected progress moving forward.  The patient understands what complications to be concerned about.  I will see the patient in routine follow up, or sooner if needed.    Activity plan: No restriction.  Steroids discussed if itching/rash persists.  Seems to be improving and rxn to prep (CHD).  Some hot flashes since surgery, monitor to see if needs any interventions.  Exercise advised.  Hoyt Koch 02/03/2018, 10:32 AM

## 2018-02-08 ENCOUNTER — Ambulatory Visit: Payer: 59 | Admitting: Obstetrics & Gynecology

## 2018-02-24 ENCOUNTER — Ambulatory Visit (INDEPENDENT_AMBULATORY_CARE_PROVIDER_SITE_OTHER): Payer: 59 | Admitting: Obstetrics and Gynecology

## 2018-02-24 ENCOUNTER — Encounter: Payer: Self-pay | Admitting: Obstetrics and Gynecology

## 2018-02-24 VITALS — BP 118/74 | Ht 60.0 in

## 2018-02-24 DIAGNOSIS — T8149XA Infection following a procedure, other surgical site, initial encounter: Secondary | ICD-10-CM

## 2018-02-24 MED ORDER — CEPHALEXIN 500 MG PO CAPS: 500.0000 mg | ORAL_CAPSULE | Freq: Two times a day (BID) | ORAL | 0 refills | Status: AC

## 2018-02-24 NOTE — Progress Notes (Signed)
   Postoperative Follow-up Patient presents post op from BSO, lysis of adhesions (laparoscopically) 5 weeks ago for RLQ pain, endometriosis.  Subjective: Patient reports marked improvement in her preop symptoms. Eating a regular diet without difficulty. Patient reports RLQ pain at incision site.  Onset of symptoms: 1 day, reports discomfort, redness, tenderness. No fever, chills. Notes a possible suture sticking out of incision site.   Objective: Vitals:   02/24/18 0956  BP: 118/74   Vital Signs: BP 118/74   Ht 5' (1.524 m)   BMI 30.66 kg/m  Constitutional: Well nourished, well developed female in no acute distress.  HEENT: normal Skin: Warm and dry.  Extremity: no edema  Abdomen: Soft, non-tender, normal bowel sounds; no bruits, organomegaly or masses. RLQ incision is with erythema, induration, warmth, and tenderness. Approximately 1 cm suture noted external to the incision, but extending into the incision line and underlying tissue.  This suture was excised. The above-noted abnormalities are on the lateral half of the incision only.  The other incision appear clean, dry, and intact without significant erythema, induration, warmth, and tenderness.  The RLQ incision site is otherwise intact without dehiscence.      Assessment: 53 y.o. s/p laparoscopic BSO/lysis of adhesions with a RLQ incision cellulitis.   Plan: 1. Postoperative cellulitis of surgical wound    - treat with keflex 500 mg po bid x 1 week.   - precautions to call office if no improvement in 3 days.  Prentice Docker. MD 02/24/2018, 10:17 AM

## 2018-03-20 ENCOUNTER — Encounter: Payer: Self-pay | Admitting: Obstetrics & Gynecology

## 2018-03-20 ENCOUNTER — Ambulatory Visit (INDEPENDENT_AMBULATORY_CARE_PROVIDER_SITE_OTHER): Payer: 59 | Admitting: Obstetrics & Gynecology

## 2018-03-20 VITALS — BP 120/80 | Ht 62.0 in | Wt 162.0 lb

## 2018-03-20 DIAGNOSIS — R1084 Generalized abdominal pain: Secondary | ICD-10-CM | POA: Diagnosis not present

## 2018-03-20 NOTE — Progress Notes (Signed)
  Postoperative Follow-up Patient presents post op from bilateral oophorectomy for pelvic pain and concern for endoemtriosis, 6 weeks ago. Images:  Pathology: A. OVARY AND FALLOPIAN TUBE, LEFT; SALPINGO-OOPHORECTOMY:  - ATROPHIC OVARIAN TISSUE WITH FIBROUS ADHESIONS.  - UNREMARKABLE FALLOPIAN TUBE.  - NEGATIVE FOR MALIGNANCY.   B. OVARY AND FALLOPIAN TUBE, RIGHT; SALPINGO-OOPHORECTOMY:  - ATROPHIC OVARIAN TISSUE WITH FIBROUS ADHESIONS.  - FALLOPIAN TUBE WITH BENIGN PARATUBAL CYST.  - NEGATIVE FOR MALIGNANCY.  Subjective: Patient reports marked improvement in her preop symptoms and ALSO in her post op cellulitis at incisions. Eating a regular diet without difficulty. The patient is not having any pain.  Activity: normal activities of daily living. Patient reports additional symptom's since surgery of None.  Objective: BP 120/80   Ht 5\' 2"  (1.575 m)   Wt 162 lb (73.5 kg)   BMI 29.63 kg/m  Physical Exam Constitutional:      General: She is not in acute distress.    Appearance: She is well-developed.  Cardiovascular:     Rate and Rhythm: Normal rate.  Pulmonary:     Effort: Pulmonary effort is normal.  Abdominal:     General: There is no distension.     Palpations: Abdomen is soft.     Tenderness: There is no abdominal tenderness.     Comments: Incision Healing Well   Musculoskeletal: Normal range of motion.  Neurological:     Mental Status: She is alert and oriented to person, place, and time.     Cranial Nerves: No cranial nerve deficit.  Skin:    General: Skin is warm and dry.   Assessment: s/p :  bilateral oophorectomy progressing well  Plan: Patient has done well after surgery with no apparent complications.  I have discussed the post-operative course to date, and the expected progress moving forward.  The patient understands what complications to be concerned about.  I will see the patient in routine follow up, or sooner if needed.    Activity plan: No  restriction. No menopausal change in sx's F/U yearly  Hoyt Koch 03/20/2018, 2:21 PM

## 2018-03-22 DIAGNOSIS — H5213 Myopia, bilateral: Secondary | ICD-10-CM | POA: Diagnosis not present

## 2018-04-01 ENCOUNTER — Telehealth: Payer: 59 | Admitting: Physician Assistant

## 2018-04-01 DIAGNOSIS — M791 Myalgia, unspecified site: Secondary | ICD-10-CM

## 2018-04-01 DIAGNOSIS — J029 Acute pharyngitis, unspecified: Secondary | ICD-10-CM

## 2018-04-01 DIAGNOSIS — R6889 Other general symptoms and signs: Secondary | ICD-10-CM | POA: Diagnosis not present

## 2018-04-01 DIAGNOSIS — R509 Fever, unspecified: Secondary | ICD-10-CM | POA: Diagnosis not present

## 2018-04-01 DIAGNOSIS — R05 Cough: Secondary | ICD-10-CM

## 2018-04-01 MED ORDER — OSELTAMIVIR PHOSPHATE 75 MG PO CAPS: 75.0000 mg | ORAL_CAPSULE | Freq: Two times a day (BID) | ORAL | 0 refills | Status: DC

## 2018-04-01 NOTE — Progress Notes (Signed)

## 2018-04-01 NOTE — Progress Notes (Signed)
I have spent 5 minutes in review of e-visit questionnaire, review and updating patient chart, medical decision making and response to patient.   Joi Leyva Cody Zuleyma Scharf, PA-C    

## 2018-05-13 MED FILL — LORazepam 0.5 MG TABS: 0.5 | 30 days supply | Qty: 60 | Fill #0

## 2018-05-20 MED FILL — METAXALONE 800 MG TABLET: 800 | 90 days supply | Qty: 90 | Fill #0

## 2018-06-13 DIAGNOSIS — Z0000 Encounter for general adult medical examination without abnormal findings: Secondary | ICD-10-CM | POA: Diagnosis not present

## 2018-06-13 DIAGNOSIS — D649 Anemia, unspecified: Secondary | ICD-10-CM | POA: Diagnosis not present

## 2018-06-14 DIAGNOSIS — Z0000 Encounter for general adult medical examination without abnormal findings: Secondary | ICD-10-CM | POA: Diagnosis not present

## 2018-06-14 DIAGNOSIS — D649 Anemia, unspecified: Secondary | ICD-10-CM | POA: Diagnosis not present

## 2018-06-20 ENCOUNTER — Other Ambulatory Visit (HOSPITAL_COMMUNITY): Payer: Self-pay | Admitting: Internal Medicine

## 2018-06-20 ENCOUNTER — Other Ambulatory Visit: Payer: Self-pay | Admitting: Internal Medicine

## 2018-06-20 DIAGNOSIS — Z0000 Encounter for general adult medical examination without abnormal findings: Secondary | ICD-10-CM | POA: Diagnosis not present

## 2018-06-20 DIAGNOSIS — E871 Hypo-osmolality and hyponatremia: Secondary | ICD-10-CM | POA: Diagnosis not present

## 2018-06-20 DIAGNOSIS — M5136 Other intervertebral disc degeneration, lumbar region: Secondary | ICD-10-CM | POA: Diagnosis not present

## 2018-06-20 DIAGNOSIS — R1013 Epigastric pain: Secondary | ICD-10-CM | POA: Diagnosis not present

## 2018-06-21 ENCOUNTER — Other Ambulatory Visit (HOSPITAL_COMMUNITY): Payer: Self-pay | Admitting: Internal Medicine

## 2018-06-21 ENCOUNTER — Other Ambulatory Visit (HOSPITAL_COMMUNITY): Payer: Self-pay | Admitting: Emergency Medicine

## 2018-06-21 DIAGNOSIS — R079 Chest pain, unspecified: Secondary | ICD-10-CM

## 2018-06-21 DIAGNOSIS — R9431 Abnormal electrocardiogram [ECG] [EKG]: Secondary | ICD-10-CM

## 2018-06-21 DIAGNOSIS — I209 Angina pectoris, unspecified: Secondary | ICD-10-CM

## 2018-06-26 ENCOUNTER — Telehealth (HOSPITAL_COMMUNITY): Payer: Self-pay | Admitting: Emergency Medicine

## 2018-06-26 NOTE — Telephone Encounter (Signed)
Left message on voicemail with name and callback number Sotiria Keast RN Navigator Cardiac Imaging [facility] [phone] Office [phone] Cell  

## 2018-06-27 ENCOUNTER — Encounter (HOSPITAL_COMMUNITY): Payer: Self-pay

## 2018-06-27 ENCOUNTER — Other Ambulatory Visit: Payer: Self-pay

## 2018-06-27 ENCOUNTER — Ambulatory Visit (HOSPITAL_COMMUNITY)
Admission: RE | Admit: 2018-06-27 | Discharge: 2018-06-27 | Disposition: A | Discharge status: Home / Self Care | Payer: 59 | Source: Ambulatory Visit | Attending: Internal Medicine | Admitting: Internal Medicine

## 2018-06-27 ENCOUNTER — Encounter: Payer: 59 | Admitting: *Deleted

## 2018-06-27 ENCOUNTER — Ambulatory Visit (HOSPITAL_COMMUNITY): Admission: RE | Admit: 2018-06-27 | Payer: 59 | Source: Ambulatory Visit

## 2018-06-27 VITALS — Temp 98.1°F

## 2018-06-27 DIAGNOSIS — R9431 Abnormal electrocardiogram [ECG] [EKG]: Secondary | ICD-10-CM | POA: Diagnosis not present

## 2018-06-27 DIAGNOSIS — R079 Chest pain, unspecified: Secondary | ICD-10-CM | POA: Diagnosis not present

## 2018-06-27 DIAGNOSIS — Z006 Encounter for examination for normal comparison and control in clinical research program: Secondary | ICD-10-CM

## 2018-06-27 MED ORDER — IOHEXOL 350 MG/ML SOLN
100.0000 mL | Freq: Once | INTRAVENOUS | Status: AC | PRN
  Administered 2018-06-27: 100 mL via INTRAVENOUS

## 2018-06-27 MED ORDER — NITROGLYCERIN 0.4 MG SL SUBL
0.8000 mg | SUBLINGUAL_TABLET | Freq: Once | SUBLINGUAL | Status: AC
  Administered 2018-06-27: 0.8 mg via SUBLINGUAL

## 2018-06-27 MED ORDER — METOPROLOL TARTRATE 5 MG/5ML IV SOLN
INTRAVENOUS | Status: AC
  Filled 2018-06-27: qty 10

## 2018-06-27 MED ORDER — METOPROLOL TARTRATE 5 MG/5ML IV SOLN
5.0000 mg | INTRAVENOUS | Status: DC | PRN
  Administered 2018-06-27: 5 mg via INTRAVENOUS

## 2018-06-27 MED ORDER — NITROGLYCERIN 0.4 MG SL SUBL
SUBLINGUAL_TABLET | SUBLINGUAL | Status: AC
  Filled 2018-06-27: qty 2

## 2018-06-27 NOTE — Research (Signed)
Subject met inclusion and exclusion criteria.  The informed consent form, study requirements and expectations were reviewed with the subject and questions and concerns were addressed prior to the signing of the consent form.  The subject verbalized understanding of the trial requirements.  The subject agreed to participate in the [email or web address] trial and signed the informed consent.  The informed consent was obtained prior to performance of any protocol-specific procedures for the subject.  A copy of the signed informed consent was given to the subject and a copy was placed in the subject's medical record. 

## 2018-07-20 DIAGNOSIS — R0602 Shortness of breath: Secondary | ICD-10-CM | POA: Diagnosis not present

## 2018-07-20 DIAGNOSIS — R079 Chest pain, unspecified: Secondary | ICD-10-CM | POA: Diagnosis not present

## 2018-07-20 DIAGNOSIS — R9431 Abnormal electrocardiogram [ECG] [EKG]: Secondary | ICD-10-CM | POA: Diagnosis not present

## 2018-11-27 ENCOUNTER — Other Ambulatory Visit: Payer: Self-pay | Admitting: Obstetrics & Gynecology

## 2018-11-27 DIAGNOSIS — Z1231 Encounter for screening mammogram for malignant neoplasm of breast: Secondary | ICD-10-CM

## 2018-12-08 ENCOUNTER — Ambulatory Visit (INDEPENDENT_AMBULATORY_CARE_PROVIDER_SITE_OTHER): Payer: 59 | Admitting: Obstetrics & Gynecology

## 2018-12-08 ENCOUNTER — Other Ambulatory Visit: Payer: Self-pay

## 2018-12-08 ENCOUNTER — Encounter: Payer: Self-pay | Admitting: Obstetrics & Gynecology

## 2018-12-08 VITALS — BP 118/80 | Ht 60.0 in | Wt 161.0 lb

## 2018-12-08 DIAGNOSIS — Z01419 Encounter for gynecological examination (general) (routine) without abnormal findings: Secondary | ICD-10-CM | POA: Diagnosis not present

## 2018-12-08 NOTE — Progress Notes (Signed)
HPI:      [name] is a 53 y.o. G3P3000 who LMP was in the past, she presents today for her annual examination.  She is s/p LSH and later BSO. Improved pain from presumed endometriosis prior to BSO last year; no menopausal sx's.  The patient has no complaints today. The patient is sexually active. Herlast pap: approximate date 2018 and was normal and last mammogram: approximate date 2019 and was normal.  The patient does perform self breast exams.  There is no notable family history of breast or ovarian cancer in her family. The patient is not taking hormone replacement therapy. Patient denies post-menopausal vaginal bleeding.   The patient has regular exercise: yes. The patient denies current symptoms of depression.    GYN Hx: Last Colonoscopy:1 year ago. Normal.  Last DEXA: never ago.    PMHx: Past Medical History:  Diagnosis Date  . Anemia    H/O  . Complication of anesthesia    HARD TO WAKE UP  . Difficult intubation 1992   FOR C-SECTION- ASPIRATED DURING SECTION   . Diverticulosis   . Endometriosis   . GERD (gastroesophageal reflux disease)   . Hx of dysplastic nevus 12/31/2015   multiple sites   . Pneumonia    AFTER -C-SECTION   Past Surgical History:  Procedure Laterality Date  . BACK SURGERY  2009  . CESAREAN SECTION  [phone]  . CHOLECYSTECTOMY  1999  . COLONOSCOPY    . DILATION AND CURETTAGE OF UTERUS  [phone]  . LAPAROSCOPIC BILATERAL SALPINGO OOPHERECTOMY N/A 01/19/2018   Procedure: LAPAROSCOPIC BILATERAL SALPINGO OOPHORECTOMY;  Surgeon: Gae Dry, MD;  Location: [facility] ORS;  Service: Gynecology;  Laterality: N/A;  . LAPAROSCOPIC LYSIS OF ADHESIONS  01/19/2018   Procedure: LAPAROSCOPIC LYSIS OF ADHESIONS;  Surgeon: Gae Dry, MD;  Location: [facility] ORS;  Service: Gynecology;;  . LAPAROSCOPIC SUPRACERVICAL HYSTERECTOMY  2010   Family History  Problem Relation Age of Onset  . Breast cancer Paternal Aunt 92  . Alzheimer's disease  Paternal Aunt   . Kidney cancer Father   . Brain cancer Maternal Uncle   . Colon cancer Paternal Uncle    Social History   Tobacco Use  . Smoking status: Never Smoker  . Smokeless tobacco: Never Used  Substance Use Topics  . Alcohol use: No  . Drug use: No    Current Outpatient Medications:  .  azelastine (ASTELIN) 0.1 % nasal spray, Place 1 spray into both nostrils 2 (two) times daily as needed for rhinitis. , Disp: , Rfl:  .  DULoxetine (CYMBALTA) 60 MG capsule, Take 60 mg by mouth every morning. , Disp: , Rfl:  .  esomeprazole (NEXIUM) 40 MG capsule, Take 40 mg by mouth 2 (two) times daily. , Disp: , Rfl:  .  fexofenadine (ALLEGRA) 180 MG tablet, Take 180 mg by mouth every morning. , Disp: , Rfl:  .  LORazepam (ATIVAN) 0.5 MG tablet, Take 1 mg by mouth at bedtime. , Disp: , Rfl:  .  metaxalone (SKELAXIN) 800 MG tablet, Take 800 mg by mouth at bedtime. , Disp: , Rfl:  .  mometasone (NASONEX) 50 MCG/ACT nasal spray, Place 2 sprays into the nose daily as needed (for congestion). , Disp: , Rfl:  .  Probiotic Product (PROBIOTIC PO), Take 1 capsule by mouth daily., Disp: , Rfl:  .  hydrochlorothiazide (HYDRODIURIL) 25 MG tablet, Take 12.5 mg by mouth daily. , Disp: , Rfl:  Allergies: Orphenadrine citrate  Review  of Systems  Constitutional: Negative for chills, fever and malaise/fatigue.  HENT: Negative for congestion, sinus pain and sore throat.   Eyes: Negative for blurred vision and pain.  Respiratory: Negative for cough and wheezing.   Cardiovascular: Negative for chest pain and leg swelling.  Gastrointestinal: Negative for abdominal pain, constipation, diarrhea, heartburn, nausea and vomiting.  Genitourinary: Negative for dysuria, frequency, hematuria and urgency.  Musculoskeletal: Negative for back pain, joint pain, myalgias and neck pain.  Skin: Negative for itching and rash.  Neurological: Negative for dizziness, tremors and weakness.  Endo/Heme/Allergies: Does not  bruise/bleed easily.  Psychiatric/Behavioral: Negative for depression. The patient is not nervous/anxious and does not have insomnia.     Objective: BP 118/80   Ht 5' (1.524 m)   Wt 161 lb (73 kg)   BMI 31.44 kg/m   Filed Weights   12/08/18 1336  Weight: 161 lb (73 kg)   Body mass index is 31.44 kg/m. Physical Exam Constitutional:      General: She is not in acute distress.    Appearance: She is well-developed.  Genitourinary:     Pelvic exam was performed with patient supine.     Vulva, inguinal canal, urethra, bladder, vagina, cervix and rectum normal.     No lesions in the vagina.     No vaginal bleeding.     No cervical polyp.     Uterus is absent.     No right or left adnexal mass present.     Right adnexa not tender.     Left adnexa not tender.     Genitourinary Comments: No cystocele  HENT:     Head: Normocephalic and atraumatic. No laceration.     Right Ear: Hearing normal.     Left Ear: Hearing normal.     Mouth/Throat:     Pharynx: Uvula midline.  Eyes:     Pupils: Pupils are equal, round, and reactive to light.  Neck:     Musculoskeletal: Normal range of motion and neck supple.     Thyroid: No thyromegaly.  Cardiovascular:     Rate and Rhythm: Normal rate and regular rhythm.     Heart sounds: No murmur. No friction rub. No gallop.   Pulmonary:     Effort: Pulmonary effort is normal. No respiratory distress.     Breath sounds: Normal breath sounds. No wheezing.  Chest:     Breasts:        Right: No mass, skin change or tenderness.        Left: No mass, skin change or tenderness.  Abdominal:     General: Bowel sounds are normal. There is no distension.     Palpations: Abdomen is soft.     Tenderness: There is no abdominal tenderness. There is no rebound.  Musculoskeletal: Normal range of motion.  Neurological:     Mental Status: She is alert and oriented to person, place, and time.     Cranial Nerves: No cranial nerve deficit.  Skin:    General:  Skin is warm and dry.  Psychiatric:        Judgment: Judgment normal.  Vitals signs reviewed.     Assessment: Annual Exam 1. Women's annual routine gynecological examination     Plan:            1.  Cervical Screening-  Pap smear schedule reviewed with patient  2. Breast screening- Exam annually and mammogram scheduled  3. Colonoscopy every 10 years, Hemoccult testing after age 10  4. Labs managed by PCP  5. Counseling for hormonal therapy: none              6. FRAX - FRAX score for assessing the 10 year probability for fracture calculated and discussed today.  Based on age and score today, DEXA is not currently scheduled.    F/U  Return in about 1 year (around 12/08/2019) for Annual.  Barnett Applebaum, MD, [facility], [facility] 12/08/2018  2:02 PM

## 2018-12-08 NOTE — Patient Instructions (Signed)
PAP every three years Mammogram every year Colonoscopy every 10 years Labs yearly (with PCP)   

## 2018-12-13 DIAGNOSIS — R079 Chest pain, unspecified: Secondary | ICD-10-CM | POA: Diagnosis not present

## 2018-12-13 DIAGNOSIS — M5136 Other intervertebral disc degeneration, lumbar region: Secondary | ICD-10-CM | POA: Diagnosis not present

## 2018-12-13 DIAGNOSIS — D649 Anemia, unspecified: Secondary | ICD-10-CM | POA: Diagnosis not present

## 2018-12-20 DIAGNOSIS — D649 Anemia, unspecified: Secondary | ICD-10-CM | POA: Diagnosis not present

## 2018-12-20 DIAGNOSIS — R600 Localized edema: Secondary | ICD-10-CM | POA: Diagnosis not present

## 2018-12-20 DIAGNOSIS — R1013 Epigastric pain: Secondary | ICD-10-CM | POA: Diagnosis not present

## 2018-12-20 DIAGNOSIS — M5136 Other intervertebral disc degeneration, lumbar region: Secondary | ICD-10-CM | POA: Diagnosis not present

## 2018-12-26 ENCOUNTER — Ambulatory Visit
Admission: RE | Admit: 2018-12-26 | Discharge: 2018-12-26 | Disposition: A | Discharge status: Home / Self Care | Payer: 59 | Source: Ambulatory Visit | Attending: Obstetrics & Gynecology | Admitting: Obstetrics & Gynecology

## 2018-12-26 ENCOUNTER — Other Ambulatory Visit: Payer: Self-pay | Admitting: Obstetrics & Gynecology

## 2018-12-26 DIAGNOSIS — Z1231 Encounter for screening mammogram for malignant neoplasm of breast: Secondary | ICD-10-CM | POA: Insufficient documentation

## 2018-12-26 DIAGNOSIS — R928 Other abnormal and inconclusive findings on diagnostic imaging of breast: Secondary | ICD-10-CM

## 2018-12-26 DIAGNOSIS — N6310 Unspecified lump in the right breast, unspecified quadrant: Secondary | ICD-10-CM

## 2019-01-08 ENCOUNTER — Ambulatory Visit
Admission: RE | Admit: 2019-01-08 | Discharge: 2019-01-08 | Disposition: A | Discharge status: Home / Self Care | Payer: 59 | Source: Ambulatory Visit | Attending: Obstetrics & Gynecology | Admitting: Obstetrics & Gynecology

## 2019-01-08 DIAGNOSIS — N6310 Unspecified lump in the right breast, unspecified quadrant: Secondary | ICD-10-CM

## 2019-01-08 DIAGNOSIS — N6001 Solitary cyst of right breast: Secondary | ICD-10-CM | POA: Diagnosis not present

## 2019-01-08 DIAGNOSIS — R922 Inconclusive mammogram: Secondary | ICD-10-CM | POA: Diagnosis not present

## 2019-01-08 DIAGNOSIS — R928 Other abnormal and inconclusive findings on diagnostic imaging of breast: Secondary | ICD-10-CM | POA: Diagnosis not present

## 2019-03-12 DIAGNOSIS — D229 Melanocytic nevi, unspecified: Secondary | ICD-10-CM | POA: Diagnosis not present

## 2019-03-12 DIAGNOSIS — D1801 Hemangioma of skin and subcutaneous tissue: Secondary | ICD-10-CM | POA: Diagnosis not present

## 2019-03-12 DIAGNOSIS — L719 Rosacea, unspecified: Secondary | ICD-10-CM | POA: Diagnosis not present

## 2019-03-12 DIAGNOSIS — L578 Other skin changes due to chronic exposure to nonionizing radiation: Secondary | ICD-10-CM | POA: Diagnosis not present

## 2019-03-12 DIAGNOSIS — L720 Epidermal cyst: Secondary | ICD-10-CM | POA: Diagnosis not present

## 2019-03-12 DIAGNOSIS — D225 Melanocytic nevi of trunk: Secondary | ICD-10-CM | POA: Diagnosis not present

## 2019-03-12 DIAGNOSIS — Z1283 Encounter for screening for malignant neoplasm of skin: Secondary | ICD-10-CM | POA: Diagnosis not present

## 2019-03-12 DIAGNOSIS — L821 Other seborrheic keratosis: Secondary | ICD-10-CM | POA: Diagnosis not present

## 2019-04-10 DIAGNOSIS — H524 Presbyopia: Secondary | ICD-10-CM | POA: Diagnosis not present

## 2019-06-14 DIAGNOSIS — E785 Hyperlipidemia, unspecified: Secondary | ICD-10-CM | POA: Diagnosis not present

## 2019-06-14 DIAGNOSIS — D649 Anemia, unspecified: Secondary | ICD-10-CM | POA: Diagnosis not present

## 2019-06-21 DIAGNOSIS — Z0000 Encounter for general adult medical examination without abnormal findings: Secondary | ICD-10-CM | POA: Diagnosis not present

## 2019-06-21 DIAGNOSIS — M5136 Other intervertebral disc degeneration, lumbar region: Secondary | ICD-10-CM | POA: Diagnosis not present

## 2019-06-21 DIAGNOSIS — D649 Anemia, unspecified: Secondary | ICD-10-CM | POA: Diagnosis not present

## 2019-06-21 DIAGNOSIS — K279 Peptic ulcer, site unspecified, unspecified as acute or chronic, without hemorrhage or perforation: Secondary | ICD-10-CM | POA: Diagnosis not present

## 2019-07-27 DIAGNOSIS — R600 Localized edema: Secondary | ICD-10-CM | POA: Diagnosis not present

## 2019-07-27 DIAGNOSIS — M5136 Other intervertebral disc degeneration, lumbar region: Secondary | ICD-10-CM | POA: Diagnosis not present

## 2019-07-27 DIAGNOSIS — G4733 Obstructive sleep apnea (adult) (pediatric): Secondary | ICD-10-CM | POA: Diagnosis not present

## 2019-07-27 DIAGNOSIS — K279 Peptic ulcer, site unspecified, unspecified as acute or chronic, without hemorrhage or perforation: Secondary | ICD-10-CM | POA: Diagnosis not present

## 2019-08-10 ENCOUNTER — Other Ambulatory Visit: Payer: Self-pay | Admitting: Internal Medicine

## 2019-09-06 DIAGNOSIS — R1013 Epigastric pain: Secondary | ICD-10-CM | POA: Diagnosis not present

## 2019-09-06 DIAGNOSIS — R600 Localized edema: Secondary | ICD-10-CM | POA: Diagnosis not present

## 2019-09-06 DIAGNOSIS — M5136 Other intervertebral disc degeneration, lumbar region: Secondary | ICD-10-CM | POA: Diagnosis not present

## 2019-09-17 ENCOUNTER — Ambulatory Visit: Payer: 59 | Admitting: Dermatology

## 2019-09-17 ENCOUNTER — Other Ambulatory Visit: Payer: Self-pay

## 2019-09-17 DIAGNOSIS — L821 Other seborrheic keratosis: Secondary | ICD-10-CM

## 2019-09-17 DIAGNOSIS — L820 Inflamed seborrheic keratosis: Secondary | ICD-10-CM

## 2019-09-17 DIAGNOSIS — L578 Other skin changes due to chronic exposure to nonionizing radiation: Secondary | ICD-10-CM

## 2019-09-17 DIAGNOSIS — L720 Epidermal cyst: Secondary | ICD-10-CM

## 2019-09-17 NOTE — Patient Instructions (Signed)

## 2019-09-17 NOTE — Progress Notes (Signed)
   Follow-Up Visit   Subjective  Paige Robles is a 54 y.o. female who presents for the following: Other (Spot of right upper back that feels raised. Husband noticed a place on her right shoulder blade that she would also like checked. Also has a spot on left lower leg that she would like checked.) and Other (Spot of right buttock - Dr. Caryl Comes saw it and prescribed sulfa, which she had a reaction to so he switched her to amoxicillin and it is better but not completely gone).  The following portions of the chart were reviewed this encounter and updated as appropriate:  Tobacco  Allergies  Meds  Problems  Med Hx  Surg Hx  Fam Hx     Review of Systems:  No other skin or systemic complaints except as noted in HPI or Assessment and Plan.  Objective  Well appearing patient in no apparent distress; mood and affect are within normal limits.  A focused examination was performed including back, left calf, buttock. Relevant physical exam findings are noted in the Assessment and Plan.  Objective  Right shoulder x 1, right scapula x 1, left calf x 1 (3): Erythematous keratotic or waxy stuck-on papule or plaque.   Objective  Right medial buttock: 1.0 cm cystic papule   Assessment & Plan    Actinic Damage - diffuse scaly erythematous macules with underlying dyspigmentation - Recommend daily broad spectrum sunscreen SPF 30+ to sun-exposed areas, reapply every 2 hours as needed.  - Call for new or changing lesions.  Seborrheic Keratoses - Stuck-on, waxy, tan-brown papules and plaques  - Discussed benign etiology and prognosis. - Observe - Call for any changes   Inflamed seborrheic keratosis (3) Right shoulder x 1, right scapula x 1, left calf x 1  Destruction of lesion - Right shoulder x 1, right scapula x 1, left calf x 1 Complexity: simple   Destruction method: cryotherapy   Informed consent: discussed and consent obtained   Timeout:  patient name, date of birth, surgical  site, and procedure verified Lesion destroyed using liquid nitrogen: Yes   Region frozen until ice ball extended beyond lesion: Yes   Outcome: patient tolerated procedure well with no complications   Post-procedure details: wound care instructions given    Epidermal inclusion cyst Right medial buttock  Cyst with history of abscess - recommend excision.  Return for surgery for cyst and TBSE as scheduled.  I, Ashok Cordia, [name], am acting as scribe for Sarina Ser, MD .  Documentation: I have reviewed the above documentation for accuracy and completeness, and I agree with the above.  Sarina Ser, MD

## 2019-09-24 ENCOUNTER — Encounter: Payer: Self-pay | Admitting: Dermatology

## 2019-10-12 ENCOUNTER — Other Ambulatory Visit: Payer: Self-pay | Admitting: Internal Medicine

## 2019-10-23 DIAGNOSIS — D649 Anemia, unspecified: Secondary | ICD-10-CM | POA: Diagnosis not present

## 2019-10-23 DIAGNOSIS — R600 Localized edema: Secondary | ICD-10-CM | POA: Diagnosis not present

## 2019-10-23 DIAGNOSIS — R1031 Right lower quadrant pain: Secondary | ICD-10-CM | POA: Diagnosis not present

## 2019-10-23 DIAGNOSIS — M5136 Other intervertebral disc degeneration, lumbar region: Secondary | ICD-10-CM | POA: Diagnosis not present

## 2019-11-09 ENCOUNTER — Other Ambulatory Visit: Payer: Self-pay | Admitting: Internal Medicine

## 2019-11-13 IMAGING — US US PELVIS COMPLETE TRANSABD/TRANSVAG
1 series · 14 of 25 positions shown · non-contrast
Comparison: None; correlation CT abdomen and pelvis 07/14/2015

CLINICAL DATA: RIGHT lower quadrant abdominal pain for 1 week



[Series 1: us pelvis complete transabd/transvag · 14 of 53 slices shown]
[im 1/53]
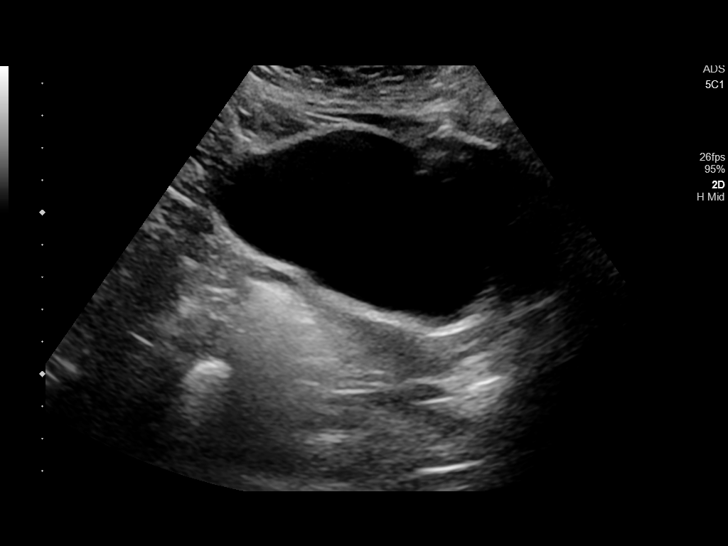
[im 5/53]
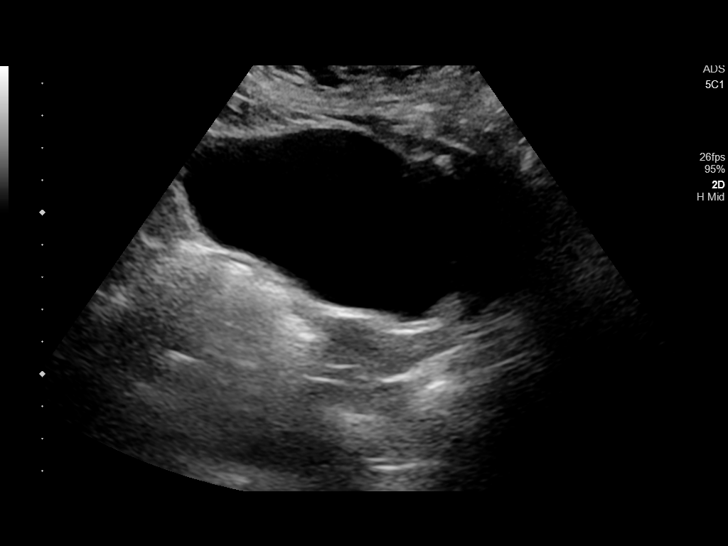
[im 9/53]
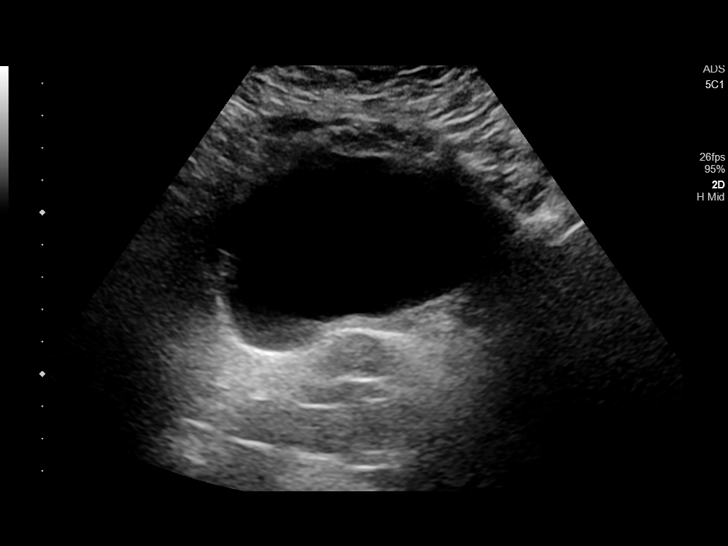
[im 14/53]
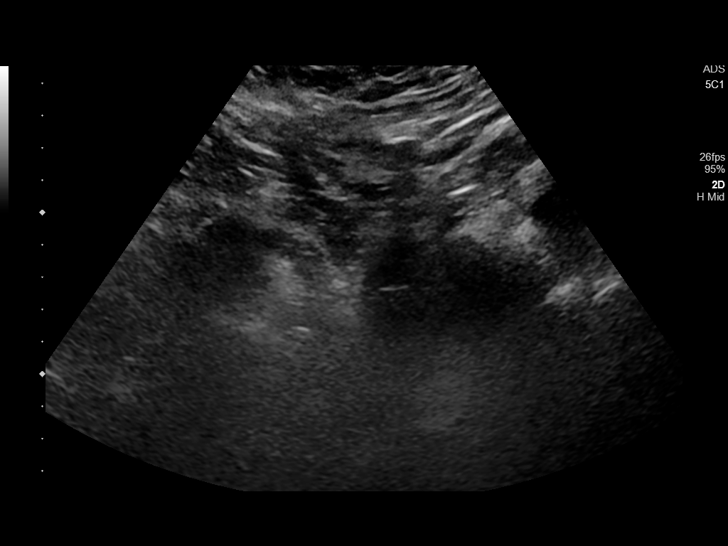
[im 18/53]
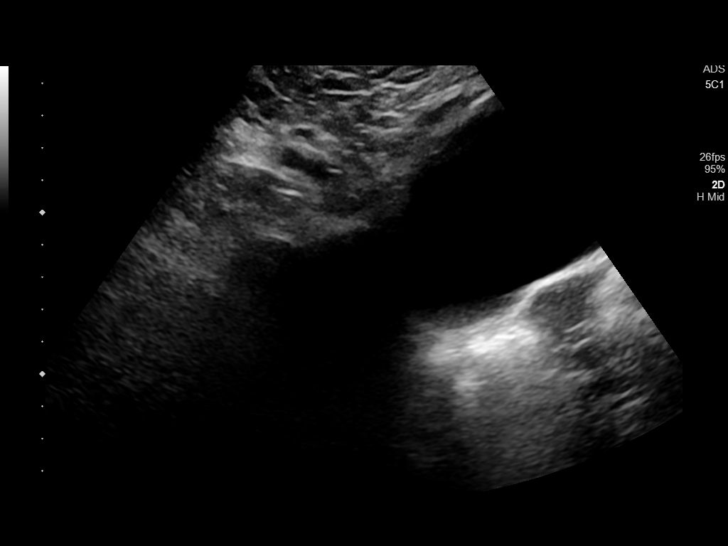
[im 20/53]
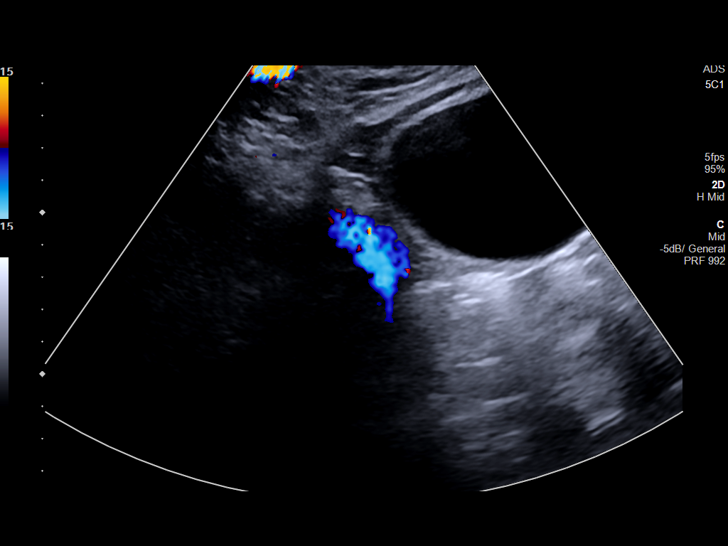
[im 24/53]
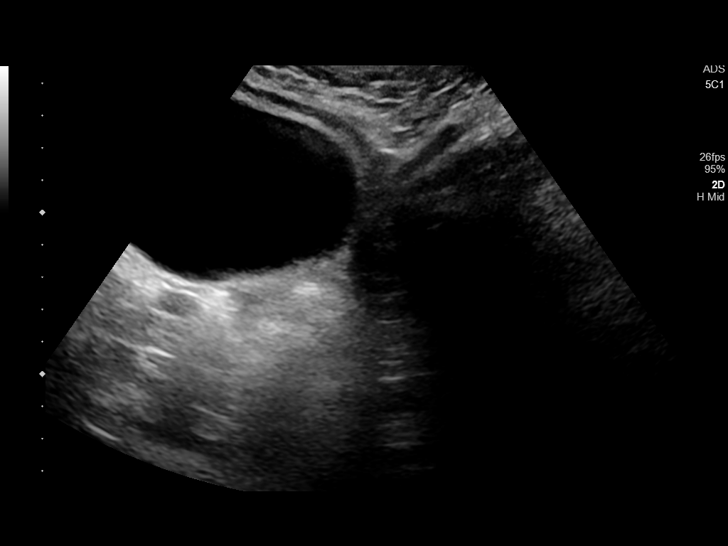
[im 29/53]
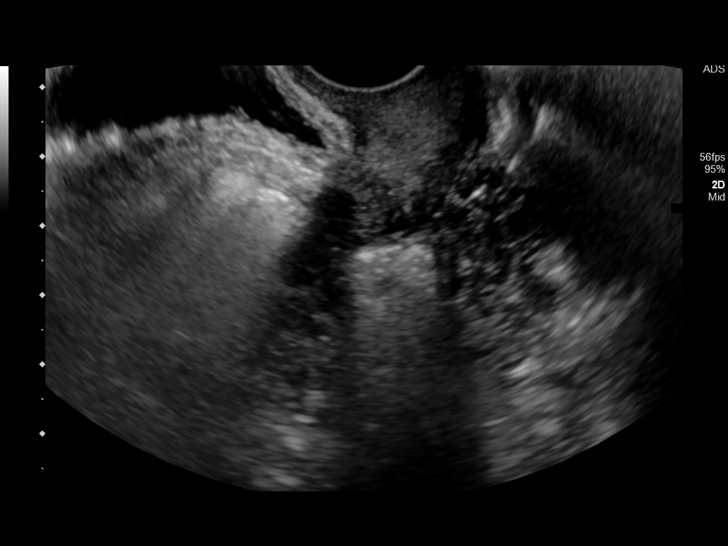
[im 33/53]
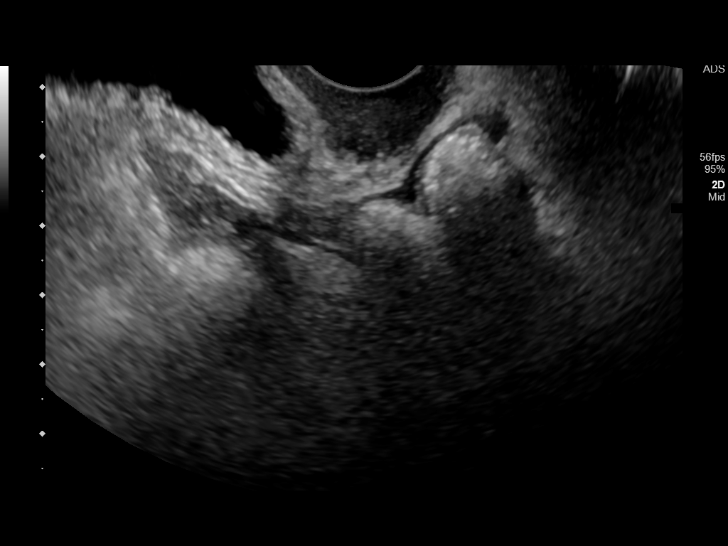
[im 35/53]
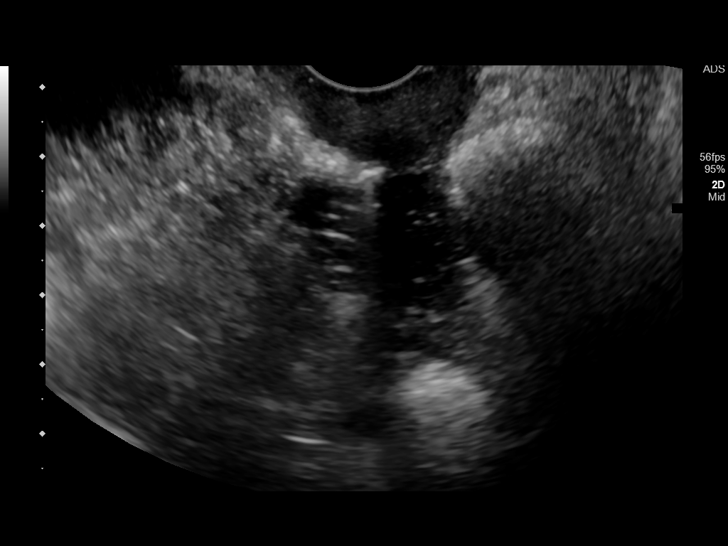
[im 40/53]
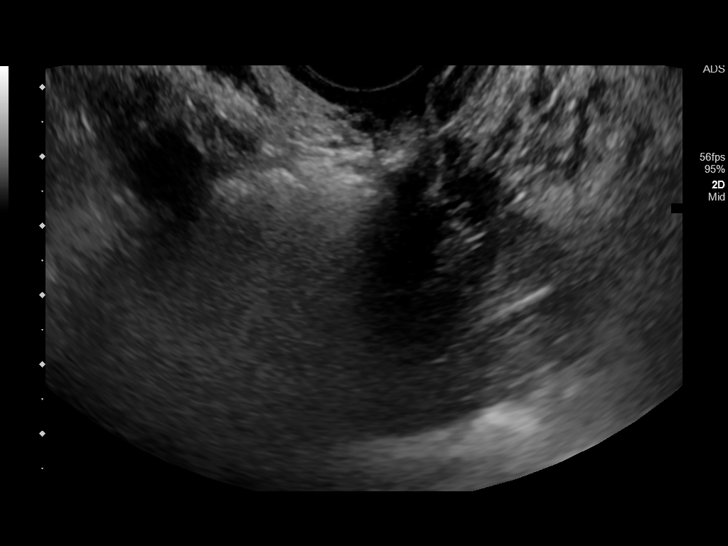
[im 44/53]
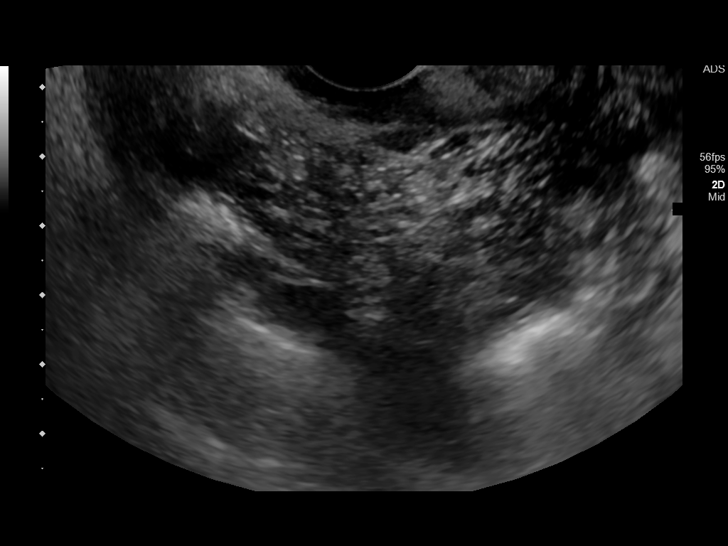
[im 48/53]
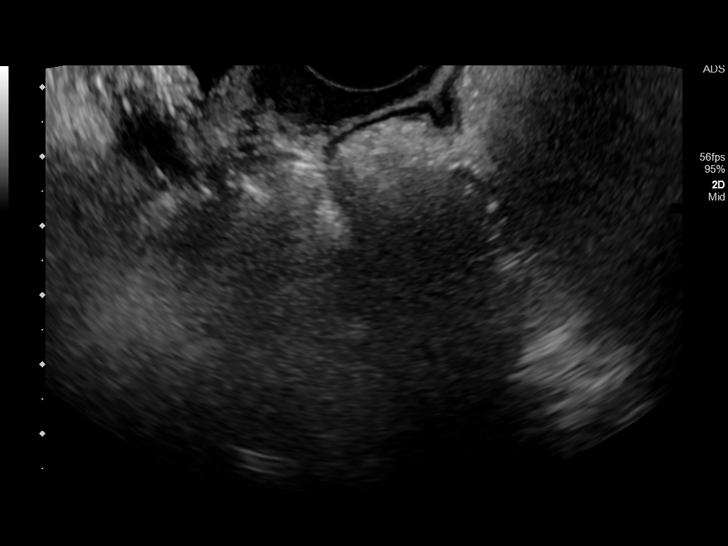
[im 53/53]
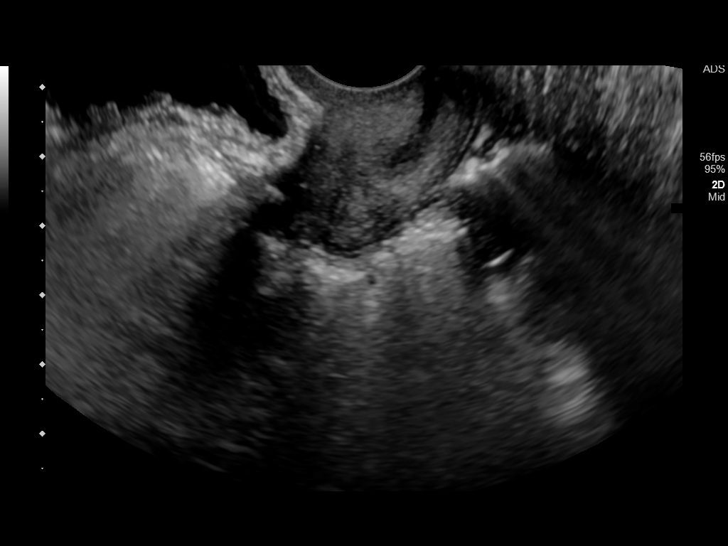

[14 of 25 positions shown; findings below may reference images not displayed]

FINDINGS: Uterus

Surgically absent, by history of supracervical laparoscopic
hysterectomy. Retained cervix noted on transvaginal imaging, grossly
unremarkable.

Endometrium

N/A

Right ovary

Not visualized, question surgically absent versus obscured by bowel

Left ovary

Not visualized, question surgically absent versus obscured by bowel

Other findings

No free pelvic fluid.  No adnexal masses.
IMPRESSION: No cause for RIGHT lower quadrant pain is visualized.

Nonvisualization of ovaries.

## 2019-11-18 IMAGING — CT CT ABD-PELV W/ CM
2 of 5 series · 16 of 46 positions shown, 18 images · IV contrast (iopamidol)
Comparison: 07/14/2015

CLINICAL DATA: Right lower quadrant abdominal pain radiating to the
back with nausea

EXAM:
CT ABDOMEN AND PELVIS WITH CONTRAST
TECHNIQUE: Multidetector CT imaging of the abdomen and pelvis was performed
using the standard protocol following bolus administration of
intravenous contrast.
CONTRAST:  100mL 2PFSHP-PUU IOPAMIDOL (2PFSHP-PUU) INJECTION 61%

[Series 7: coronal st · coronal · 0.83mm/px · 3 of 101 slices shown]
[im 34/101  soft-tissue]
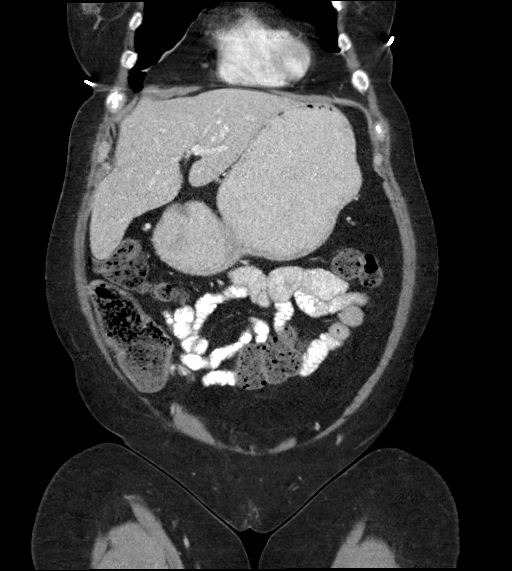
[im 45/101  soft-tissue]
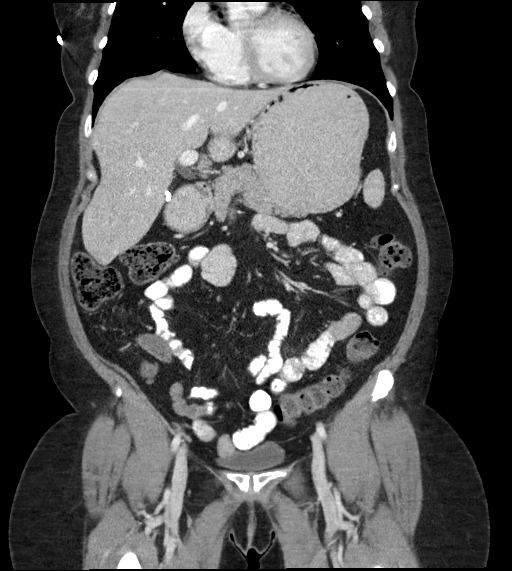
[im 56/101  soft-tissue]
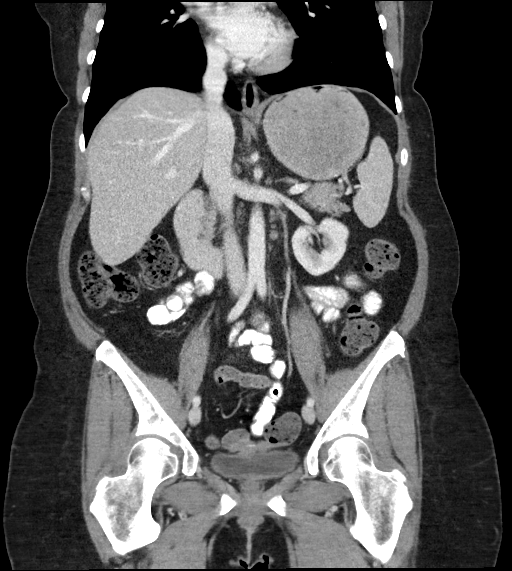

[Series 9: axial st · axial · 0.65mm/px · z∈[-462,-48]mm · 13 of 95 slices shown, 15 images]
[im 6/95  soft-tissue]
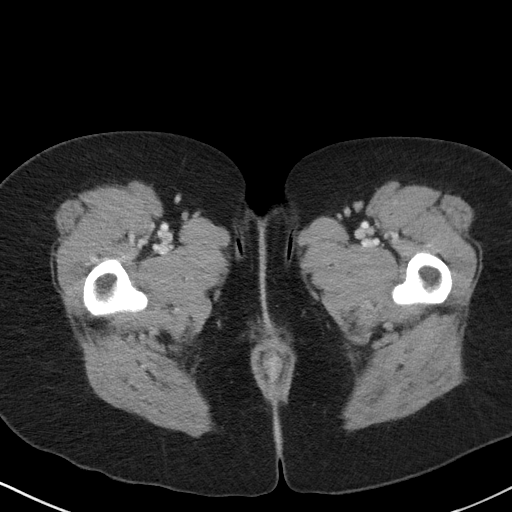
[im 6/95  bone]
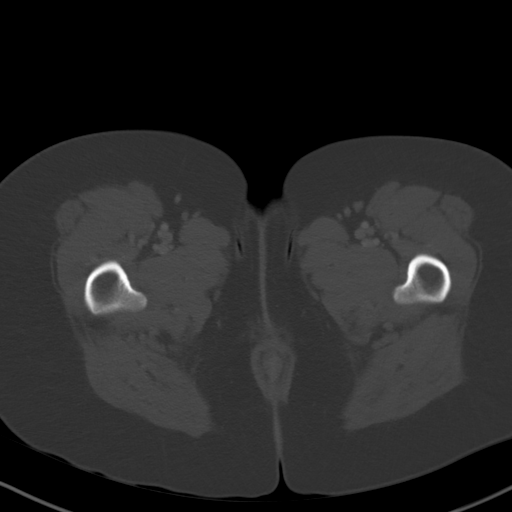
[im 11/95  soft-tissue]
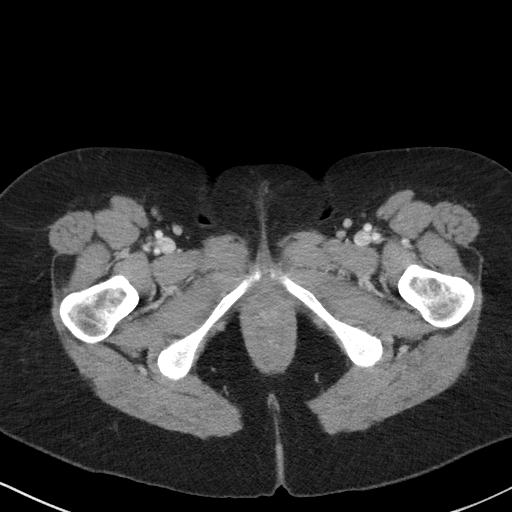
[im 21/95  soft-tissue]
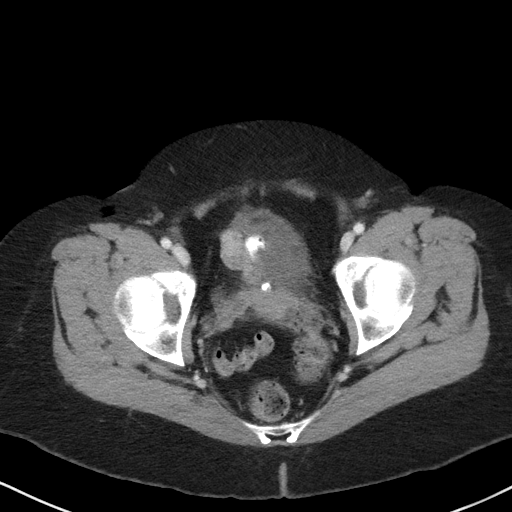
[im 27/95  soft-tissue]
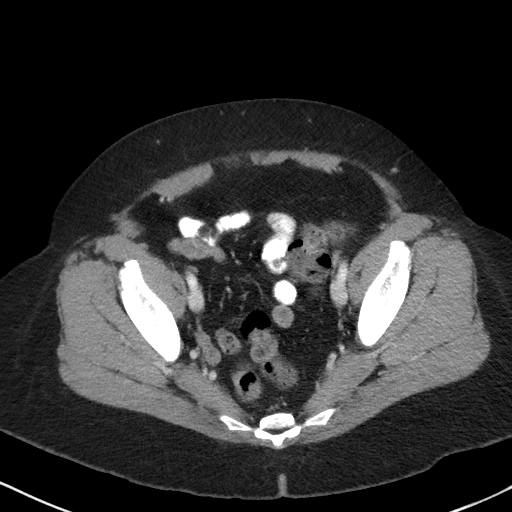
[im 32/95  soft-tissue]
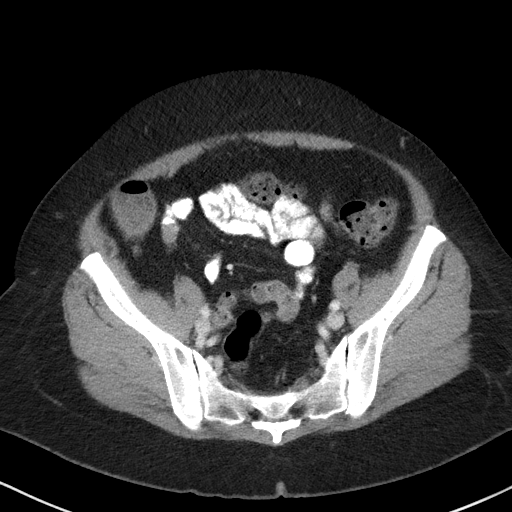
[im 42/95  soft-tissue]
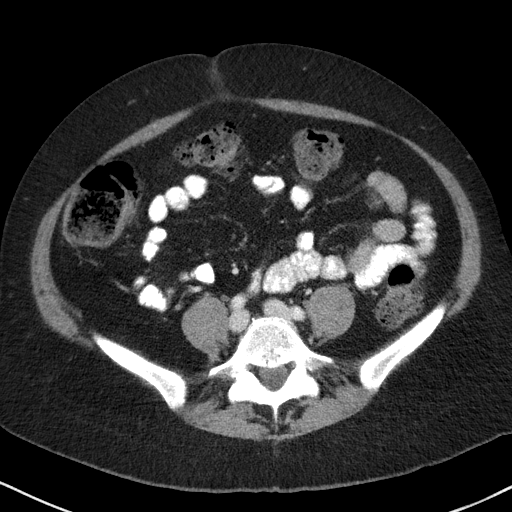
[im 48/95  soft-tissue]
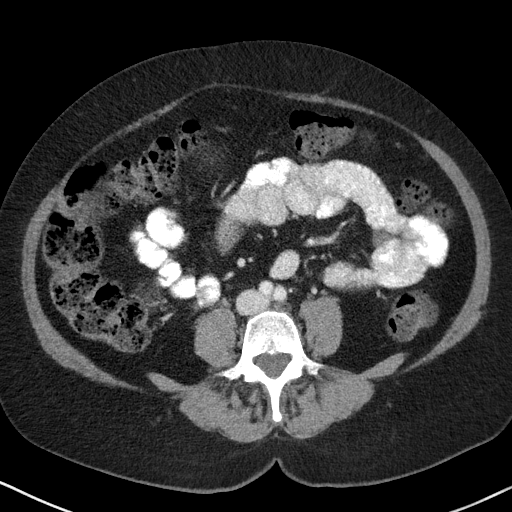
[im 53/95  soft-tissue]
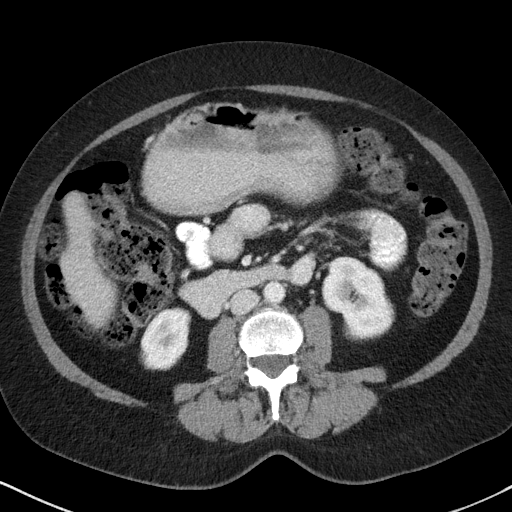
[im 63/95  soft-tissue]
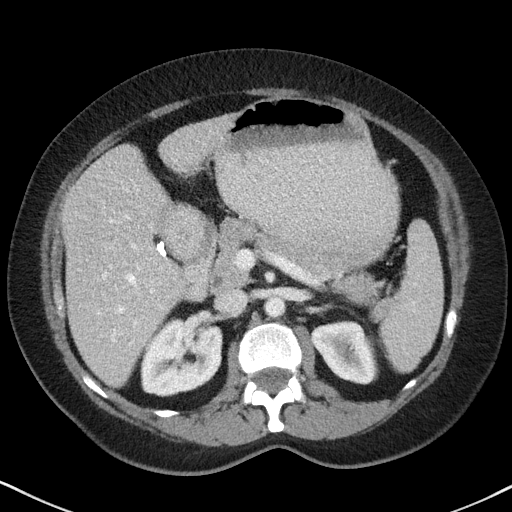
[im 63/95  bone]
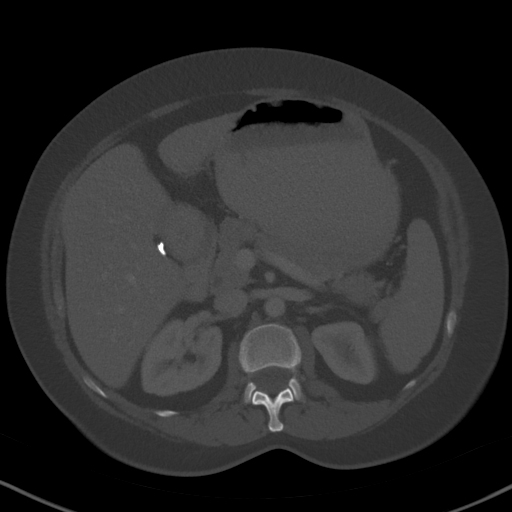
[im 68/95  soft-tissue]
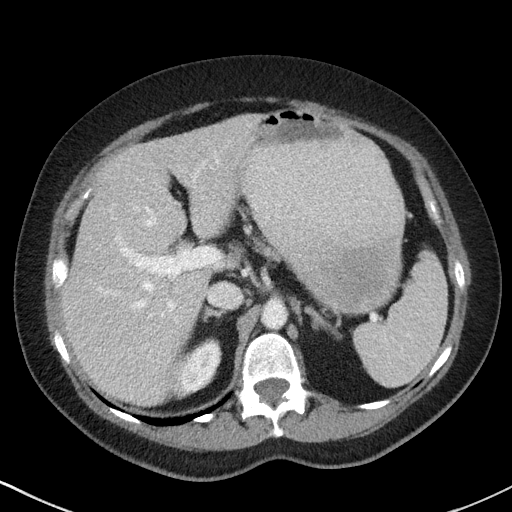
[im 74/95  soft-tissue]
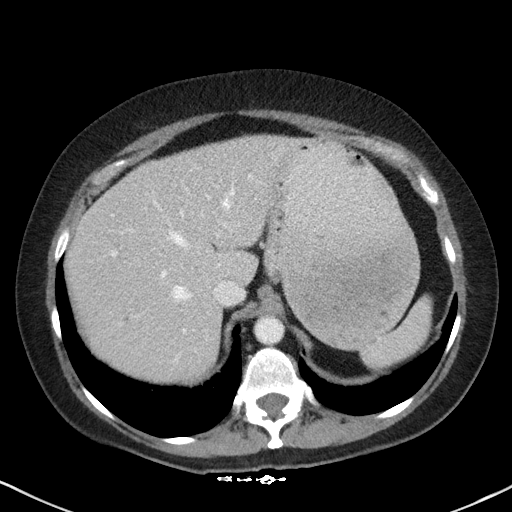
[im 84/95  soft-tissue]
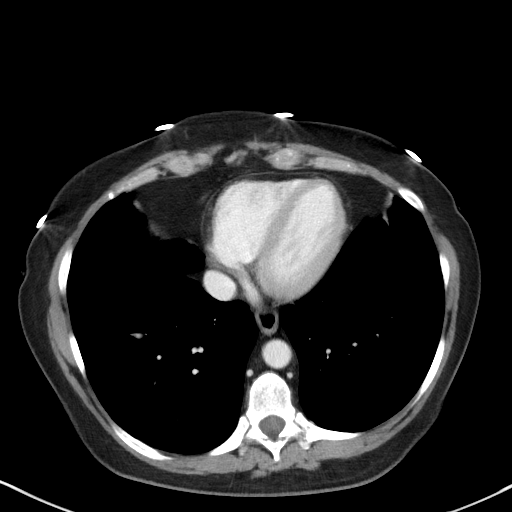
[im 89/95  soft-tissue]
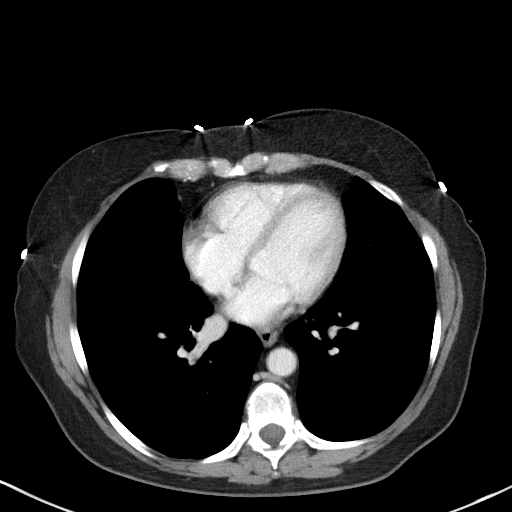

[16 of 46 positions shown; findings below may reference images not displayed]

FINDINGS: Lower chest: Stable 5 mm left lower lobe peripheral nodule, image 18
series 6. Minor basilar atelectasis. Normal heart size. No
pericardial or pleural effusion.

Hepatobiliary: No focal liver abnormality is seen. Status post
cholecystectomy. No biliary dilatation.

Pancreas: Unremarkable. No pancreatic ductal dilatation or
surrounding inflammatory changes.

Spleen: Normal in size without focal abnormality.

Adrenals/Urinary Tract: Adrenal glands are unremarkable. Kidneys are
normal, without renal calculi, focal lesion, or hydronephrosis.
Bladder is unremarkable.

Stomach/Bowel: Negative for bowel obstruction, significant
dilatation, ileus, or free air. Gastric distention noted. Normal
appearing appendix. Very minor colonic diverticulosis. No acute
inflammatory process, fluid collection, or abscess.

Vascular/Lymphatic: No significant vascular findings are present. No
enlarged abdominal or pelvic lymph nodes.

Reproductive: Status post hysterectomy. No adnexal masses.

Other: No abdominal wall hernia or abnormality. No abdominopelvic
ascites.

Musculoskeletal: Degenerative changes of the spine, most pronounced
at L5-S1 with disc space narrowing, sclerosis and vacuum disc
phenomena. No acute osseous finding.
IMPRESSION: No acute intra-abdominal or pelvic finding by CT.

Remote cholecystectomy and hysterectomy

Normal appendix

Colonic diverticulosis without acute inflammatory process

No fluid collection or abscess

Stable 5 mm left lower lobe pulmonary nodule compared to 07/14/2015.

## 2019-11-20 ENCOUNTER — Encounter: Payer: 59 | Admitting: Dermatology

## 2019-11-21 ENCOUNTER — Other Ambulatory Visit: Payer: Self-pay | Admitting: Internal Medicine

## 2019-11-22 ENCOUNTER — Telehealth: Payer: Self-pay

## 2019-11-22 NOTE — Telephone Encounter (Signed)
Pt calling; is not able to come in to see [facility]mer until Dec.; would like to go ahead and have mammogram done; needs order.  [phone]

## 2019-11-23 ENCOUNTER — Other Ambulatory Visit: Payer: Self-pay | Admitting: Obstetrics & Gynecology

## 2019-11-23 DIAGNOSIS — Z1231 Encounter for screening mammogram for malignant neoplasm of breast: Secondary | ICD-10-CM

## 2019-11-23 NOTE — Telephone Encounter (Signed)
Pt aware.

## 2019-11-23 NOTE — Telephone Encounter (Signed)
Order placed, sch after 11/24

## 2019-12-04 ENCOUNTER — Other Ambulatory Visit: Payer: Self-pay

## 2019-12-04 ENCOUNTER — Ambulatory Visit (INDEPENDENT_AMBULATORY_CARE_PROVIDER_SITE_OTHER): Payer: 59 | Admitting: Dermatology

## 2019-12-04 ENCOUNTER — Other Ambulatory Visit: Payer: Self-pay | Admitting: Dermatology

## 2019-12-04 DIAGNOSIS — D485 Neoplasm of uncertain behavior of skin: Secondary | ICD-10-CM | POA: Diagnosis not present

## 2019-12-04 DIAGNOSIS — L905 Scar conditions and fibrosis of skin: Secondary | ICD-10-CM | POA: Diagnosis not present

## 2019-12-04 MED ORDER — MUPIROCIN 2 % EX OINT: 1.0000 "application " | TOPICAL_OINTMENT | Freq: Every day | CUTANEOUS | 0 refills | Status: DC

## 2019-12-04 NOTE — Progress Notes (Signed)
   Follow-Up Visit   Subjective  Paige Robles is a 54 y.o. female who presents for the following: Cyst (vs other of right medial buttock - Excise today).  The following portions of the chart were reviewed this encounter and updated as appropriate:  Tobacco  Allergies  Meds  Problems  Med Hx  Surg Hx  Fam Hx     Review of Systems:  No other skin or systemic complaints except as noted in HPI or Assessment and Plan.  Objective  Well appearing patient in no apparent distress; mood and affect are within normal limits.  A focused examination was performed including buttock. Relevant physical exam findings are noted in the Assessment and Plan.  Objective  Right medial buttock: 1.2 x 0.6 cm cystic papule   Assessment & Plan  Neoplasm of uncertain behavior of skin Right medial buttock  mupirocin ointment (BACTROBAN) 2 %  Skin excision  Lesion length (cm):  1.2 Lesion width (cm):  0.6 Margin per side (cm):  0.2 Total excision diameter (cm):  1.6 Informed consent: discussed and consent obtained   Timeout: patient name, date of birth, surgical site, and procedure verified   Procedure prep:  Patient was prepped and draped in usual sterile fashion Prep type:  Isopropyl alcohol and povidone-iodine Anesthesia: the lesion was anesthetized in a standard fashion   Anesthetic:  1% lidocaine w/ epinephrine 1-100,000 buffered w/ 8.4% NaHCO3 Instrument used: #15 blade   Hemostasis achieved with: pressure   Hemostasis achieved with comment:  Electrocautery Outcome: patient tolerated procedure well with no complications   Post-procedure details: sterile dressing applied and wound care instructions given   Dressing type: bandage and pressure dressing (mupirocin)    Skin repair Complexity:  Complex Final length (cm):  2 Reason for type of repair: reduce tension to allow closure, reduce the risk of dehiscence, infection, and necrosis, reduce subcutaneous dead space and avoid a  hematoma, allow closure of the large defect, preserve normal anatomy, preserve normal anatomical and functional relationships and enhance both functionality and cosmetic results   Undermining: area extensively undermined   Undermining comment:  Undermining defect 1.0 cm Subcutaneous layers (deep stitches):  Suture size:  4-0 Suture type: Vicryl (polyglactin 910)   Subcutaneous suture technique: inverted dermal. Fine/surface layer approximation (top stitches):  Suture size:  4-0 Suture type: nylon   Stitches: simple running   Suture removal (days):  7 Hemostasis achieved with: suture and pressure Outcome: patient tolerated procedure well with no complications   Post-procedure details: sterile dressing applied and wound care instructions given   Dressing type: bandage and pressure dressing (mupirocin)    Specimen 1 - Surgical pathology Differential Diagnosis: Cyst vs other Check Margins: No 1.2 x 0.6 cm cystic papule  Doxycycline 100 mg 1 po qd with food and plenty of fluid - samples given  Return in about 1 week (around 12/11/2019) for suture removal.  I, Ashok Cordia, [name], am acting as scribe for Sarina Ser, MD .  Documentation: I have reviewed the above documentation for accuracy and completeness, and I agree with the above.  Sarina Ser, MD

## 2019-12-04 NOTE — Patient Instructions (Signed)

## 2019-12-05 ENCOUNTER — Encounter: Payer: Self-pay | Admitting: Dermatology

## 2019-12-05 ENCOUNTER — Telehealth: Payer: Self-pay

## 2019-12-05 NOTE — Telephone Encounter (Signed)
Spoke with patient regarding surgery. She is doing fine/hd 

## 2019-12-10 ENCOUNTER — Other Ambulatory Visit: Payer: Self-pay | Admitting: Internal Medicine

## 2019-12-10 ENCOUNTER — Ambulatory Visit: Payer: 59 | Admitting: Dermatology

## 2019-12-11 ENCOUNTER — Encounter: Payer: Self-pay | Admitting: Dermatology

## 2019-12-11 ENCOUNTER — Ambulatory Visit (INDEPENDENT_AMBULATORY_CARE_PROVIDER_SITE_OTHER): Payer: 59 | Admitting: Dermatology

## 2019-12-11 ENCOUNTER — Other Ambulatory Visit: Payer: Self-pay

## 2019-12-11 DIAGNOSIS — Z4802 Encounter for removal of sutures: Secondary | ICD-10-CM

## 2019-12-11 DIAGNOSIS — L905 Scar conditions and fibrosis of skin: Secondary | ICD-10-CM

## 2019-12-11 NOTE — Progress Notes (Signed)
   Follow-Up Visit   Subjective  Paige Robles is a 54 y.o. female who presents for the following: post op./suture removal (patient is here today for suture removal and to discuss pathology results).  The following portions of the chart were reviewed this encounter and updated as appropriate:  Tobacco  Allergies  Meds  Problems  Med Hx  Surg Hx  Fam Hx     Review of Systems:  No other skin or systemic complaints except as noted in HPI or Assessment and Plan.  Objective  Well appearing patient in no apparent distress; mood and affect are within normal limits.  A focused examination was performed including the buttock. Relevant physical exam findings are noted in the Assessment and Plan.  Objective  R med buttock: Healing excision site  Assessment & Plan  Scar conditions and fibrosis of skin -secondary to ruptured inflamed cyst R med buttock  Encounter for Removal of Sutures - Incision site at the R med buttock is clean, dry and intact - Wound cleansed, sutures removed, wound cleansed and steri strips applied.  - Discussed pathology results showing fibrosis of the skin consistent with involuted cyst.  - Patient advised to keep steri-strips dry until they fall off. - Scars remodel for a full year. - Once steri-strips fall off, patient can apply over-the-counter silicone scar cream each night to help with scar remodeling if desired. - Patient advised to call with any concerns or if they notice any new or changing lesions.   Return for TBSE, appointment as scheduled.  Luther Redo, [name], am acting as scribe for Sarina Ser, MD .  Documentation: I have reviewed the above documentation for accuracy and completeness, and I agree with the above.  Sarina Ser, MD

## 2019-12-17 ENCOUNTER — Ambulatory Visit (INDEPENDENT_AMBULATORY_CARE_PROVIDER_SITE_OTHER): Payer: 59 | Admitting: Dermatology

## 2019-12-17 ENCOUNTER — Other Ambulatory Visit: Payer: Self-pay

## 2019-12-17 DIAGNOSIS — L929 Granulomatous disorder of the skin and subcutaneous tissue, unspecified: Secondary | ICD-10-CM

## 2019-12-17 DIAGNOSIS — D235 Other benign neoplasm of skin of trunk: Secondary | ICD-10-CM

## 2019-12-17 DIAGNOSIS — L918 Other hypertrophic disorders of the skin: Secondary | ICD-10-CM

## 2019-12-17 DIAGNOSIS — D492 Neoplasm of unspecified behavior of bone, soft tissue, and skin: Secondary | ICD-10-CM

## 2019-12-17 NOTE — Progress Notes (Deleted)
   Follow-Up Visit   Subjective  Paige Robles is a 54 y.o. female who presents for the following: Follow-up (1 week f/u cyst excision on the R med buttock, pt concerned about a knot on the scar site ).   The following portions of the chart were reviewed this encounter and updated as appropriate:      Review of Systems:  No other skin or systemic complaints except as noted in HPI or Assessment and Plan.  Objective  Well appearing patient in no apparent distress; mood and affect are within normal limits.  A focused examination was performed including buttock . Relevant physical exam findings are noted in the Assessment and Plan.  Objective  perianal: 0.4 cm pink papule    Assessment & Plan  Neoplasm of skin perianal  Skin / nail biopsy Type of biopsy: tangential   Informed consent: discussed and consent obtained   Patient was prepped and draped in usual sterile fashion: area prepped with alochol. Anesthesia: the lesion was anesthetized in a standard fashion   Anesthetic:  1% lidocaine w/ epinephrine 1-100,000 buffered w/ 8.4% NaHCO3 Instrument used: scissors   Hemostasis achieved with: pressure, aluminum chloride and electrodesiccation   Outcome: patient tolerated procedure well   Post-procedure details: wound care instructions given   Post-procedure details comment:  Ointment and small bandage  Specimen 1 - Surgical pathology Differential Diagnosis: R/O Irritated Nevus vs Cyst vs other  Check Margins: No 0.4 cm pink papule  Return for as scheduled .  IMarye Round, [name], am acting as scribe for Sarina Ser, MD .

## 2019-12-17 NOTE — Patient Instructions (Signed)

## 2019-12-17 NOTE — Progress Notes (Signed)
   Follow-Up Visit   Subjective  Paige Robles is a 54 y.o. female who presents for the following: Follow-up (1 week f/u cyst excision on the R med buttock, pt concerned about a knot on the scar site that was not present until yesterday.  The following portions of the chart were reviewed this encounter and updated as appropriate:  Tobacco  Allergies  Meds  Problems  Med Hx  Surg Hx  Fam Hx     Review of Systems:  No other skin or systemic complaints except as noted in HPI or Assessment and Plan.  Objective  Well appearing patient in no apparent distress; mood and affect are within normal limits.  A focused examination was performed including buttock . Relevant physical exam findings are noted in the Assessment and Plan.  Objective  Rectal /anal: 0.4 cm pink papule    Assessment & Plan  Neoplasm of skin Rectal/ anal - medial to cyst excision scar  Skin / nail biopsy Type of biopsy: tangential   Informed consent: discussed and consent obtained   Patient was prepped and draped in usual sterile fashion: area prepped with alochol. Anesthesia: the lesion was anesthetized in a standard fashion   Anesthetic:  1% lidocaine w/ epinephrine 1-100,000 buffered w/ 8.4% NaHCO3 Instrument used: scissors   Hemostasis achieved with: pressure, aluminum chloride and electrodesiccation   Outcome: patient tolerated procedure well   Post-procedure details: wound care instructions given   Post-procedure details comment:  Ointment and small bandage  Specimen 1 - Surgical pathology Differential Diagnosis: R/O Irritated Nevus vs Cyst vs other  Check Margins: No 0.4 cm pink papule  Return for as scheduled .  IMarye Round, [name], am acting as scribe for Sarina Ser, MD .  Documentation: I have reviewed the above documentation for accuracy and completeness, and I agree with the above.  Sarina Ser, MD

## 2019-12-18 ENCOUNTER — Encounter: Payer: Self-pay | Admitting: Dermatology

## 2019-12-18 DIAGNOSIS — G4733 Obstructive sleep apnea (adult) (pediatric): Secondary | ICD-10-CM | POA: Diagnosis not present

## 2019-12-20 ENCOUNTER — Telehealth: Payer: Self-pay

## 2019-12-20 NOTE — Telephone Encounter (Signed)
Patient advised of biopsy results.

## 2019-12-20 NOTE — Telephone Encounter (Signed)
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 Check next visit.

## 2019-12-20 NOTE — Telephone Encounter (Signed)
LM on VM to return my call. 

## 2019-12-28 DIAGNOSIS — G4733 Obstructive sleep apnea (adult) (pediatric): Secondary | ICD-10-CM | POA: Diagnosis not present

## 2020-01-01 ENCOUNTER — Other Ambulatory Visit: Payer: Self-pay

## 2020-01-01 ENCOUNTER — Ambulatory Visit
Admission: RE | Admit: 2020-01-01 | Discharge: 2020-01-01 | Disposition: A | Discharge status: Home / Self Care | Payer: 59 | Source: Ambulatory Visit | Attending: Obstetrics & Gynecology | Admitting: Obstetrics & Gynecology

## 2020-01-01 DIAGNOSIS — Z1231 Encounter for screening mammogram for malignant neoplasm of breast: Secondary | ICD-10-CM | POA: Insufficient documentation

## 2020-01-02 ENCOUNTER — Other Ambulatory Visit (HOSPITAL_COMMUNITY)
Admission: RE | Admit: 2020-01-02 | Discharge: 2020-01-02 | Disposition: A | Discharge status: Home / Self Care | Payer: 59 | Source: Ambulatory Visit | Attending: Obstetrics & Gynecology | Admitting: Obstetrics & Gynecology

## 2020-01-02 ENCOUNTER — Ambulatory Visit (INDEPENDENT_AMBULATORY_CARE_PROVIDER_SITE_OTHER): Payer: 59 | Admitting: Obstetrics & Gynecology

## 2020-01-02 ENCOUNTER — Encounter: Payer: Self-pay | Admitting: Obstetrics & Gynecology

## 2020-01-02 VITALS — BP 120/80 | Ht 60.0 in | Wt 168.0 lb

## 2020-01-02 DIAGNOSIS — Z01419 Encounter for gynecological examination (general) (routine) without abnormal findings: Secondary | ICD-10-CM | POA: Diagnosis not present

## 2020-01-02 DIAGNOSIS — Z1211 Encounter for screening for malignant neoplasm of colon: Secondary | ICD-10-CM

## 2020-01-02 DIAGNOSIS — Z124 Encounter for screening for malignant neoplasm of cervix: Secondary | ICD-10-CM | POA: Insufficient documentation

## 2020-01-02 NOTE — Patient Instructions (Signed)
PAP every 5 years Mammogram every year    Call [phone] to schedule at [facility] Colonoscopy every 10 years Labs yearly (with PCP)  Thank you for choosing [facility]. As part of our ongoing efforts to improve patient experience, we would appreciate your feedback. Please fill out the short survey that you will receive by mail or MyChart. Your opinion is important to [place]! - Dr. Kenton Kingfisher

## 2020-01-02 NOTE — Progress Notes (Signed)
HPI:      Paige Robles is a 54 y.o. G3P3000 who LMP was in the past s/p LSH and later BSO, she presents today for her annual examination.  The patient has no complaints today. The patient is sexually active. Herlast pap: approximate date 2018 and was normal and last mammogram: approximate date 2020 and was normal.  The patient does perform self breast exams.  There is no notable family history of breast or ovarian cancer in her family. The patient is not taking hormone replacement therapy. Patient denies post-menopausal vaginal bleeding.   The patient has regular exercise: yes. The patient denies current symptoms of depression.    GYN Hx: Last Colonoscopy:2 years ago. Normal.  Last DEXA: never ago.    PMHx: Past Medical History:  Diagnosis Date  . Anemia    H/O  . Complication of anesthesia    HARD TO WAKE UP  . Difficult intubation 1992   FOR C-SECTION- ASPIRATED DURING SECTION   . Diverticulosis   . Dysplastic nevus 12/31/2015   Right low back paraspinal 1.5cm lat to spine. Mild atypia, lateral margin involved.  Marland Kitchen Dysplastic nevus 12/31/2015   Left UQA periumbilical. Mild atypia, margins free.  Marland Kitchen Dysplastic nevus 03/12/2019   Right lateral waistline. Mild atypia, limited margins free.  . Endometriosis   . GERD (gastroesophageal reflux disease)   . Pneumonia    AFTER -C-SECTION   Past Surgical History:  Procedure Laterality Date  . ABDOMINAL HYSTERECTOMY    . BACK SURGERY  2009  . CESAREAN SECTION  [phone]  . CHOLECYSTECTOMY  1999  . COLONOSCOPY    . DILATION AND CURETTAGE OF UTERUS  [phone]  . LAPAROSCOPIC BILATERAL SALPINGO OOPHERECTOMY N/A 01/19/2018   Procedure: LAPAROSCOPIC BILATERAL SALPINGO OOPHORECTOMY;  Surgeon: Gae Dry, MD;  Location: [facility] ORS;  Service: Gynecology;  Laterality: N/A;  . LAPAROSCOPIC LYSIS OF ADHESIONS  01/19/2018   Procedure: LAPAROSCOPIC LYSIS OF ADHESIONS;  Surgeon: Gae Dry, MD;  Location: [facility] ORS;  Service:  Gynecology;;  . LAPAROSCOPIC SUPRACERVICAL HYSTERECTOMY  2010   Family History  Problem Relation Age of Onset  . Breast cancer Paternal Aunt 65  . Alzheimer's disease Paternal Aunt   . Kidney cancer Father   . Brain cancer Maternal Uncle   . Colon cancer Paternal Uncle    Social History   Tobacco Use  . Smoking status: Never Smoker  . Smokeless tobacco: Never Used  Vaping Use  . Vaping Use: Never used  Substance Use Topics  . Alcohol use: No  . Drug use: No    Current Outpatient Medications:  .  Armodafinil 50 MG tablet, Take by mouth., Disp: , Rfl:  .  azelastine (ASTELIN) 0.1 % nasal spray, Place 1 spray into both nostrils 2 (two) times daily as needed for rhinitis. , Disp: , Rfl:  .  DULoxetine (CYMBALTA) 60 MG capsule, Take 60 mg by mouth every morning. , Disp: , Rfl:  .  esomeprazole (NEXIUM) 40 MG capsule, Take 40 mg by mouth 2 (two) times daily. , Disp: , Rfl:  .  fexofenadine (ALLEGRA) 180 MG tablet, Take 180 mg by mouth every morning. , Disp: , Rfl:  .  LORazepam (ATIVAN) 0.5 MG tablet, Take 1 mg by mouth at bedtime. , Disp: , Rfl:  .  metaxalone (SKELAXIN) 800 MG tablet, Take 800 mg by mouth at bedtime. , Disp: , Rfl:  .  mometasone (NASONEX) 50 MCG/ACT nasal spray, Place 2 sprays into the nose  daily as needed (for congestion). , Disp: , Rfl:  .  mupirocin ointment (BACTROBAN) 2 %, Apply 1 application topically daily. With dressing changes, Disp: 22 g, Rfl: 0 .  ondansetron (ZOFRAN-ODT) 4 MG disintegrating tablet, Take 4 mg by mouth every 8 (eight) hours as needed., Disp: , Rfl:  .  Probiotic Product (PROBIOTIC PO), Take 1 capsule by mouth daily., Disp: , Rfl:  .  hydrochlorothiazide (HYDRODIURIL) 25 MG tablet, Take 12.5 mg by mouth daily. , Disp: , Rfl:  Allergies: Orphenadrine citrate and Sulfamethoxazole-trimethoprim  Review of Systems  Constitutional: Negative for chills, fever and malaise/fatigue.  HENT: Negative for congestion, sinus pain and sore throat.     Eyes: Negative for blurred vision and pain.  Respiratory: Negative for cough and wheezing.   Cardiovascular: Negative for chest pain and leg swelling.  Gastrointestinal: Negative for abdominal pain, constipation, diarrhea, heartburn, nausea and vomiting.  Genitourinary: Negative for dysuria, frequency, hematuria and urgency.  Musculoskeletal: Negative for back pain, joint pain, myalgias and neck pain.  Skin: Negative for itching and rash.  Neurological: Negative for dizziness, tremors and weakness.  Endo/Heme/Allergies: Does not bruise/bleed easily.  Psychiatric/Behavioral: Negative for depression. The patient is not nervous/anxious and does not have insomnia.     Objective: BP 120/80   Ht 5' (1.524 m)   Wt 168 lb (76.2 kg)   BMI 32.81 kg/m   Filed Weights   01/02/20 1356  Weight: 168 lb (76.2 kg)   Body mass index is 32.81 kg/m. Physical Exam Constitutional:      General: She is not in acute distress.    Appearance: She is well-developed.  Genitourinary:     Pelvic exam was performed with patient supine.     Vulva, urethra, bladder, vagina, cervix and rectum normal.     No lesions in the vagina.     No vaginal bleeding.     Uterus is absent.     No right or left adnexal mass present.     Right adnexa not tender.     Left adnexa not tender.     Genitourinary Comments: No cystocele  HENT:     Head: Normocephalic and atraumatic. No laceration.     Right Ear: Hearing normal.     Left Ear: Hearing normal.     Mouth/Throat:     Pharynx: Uvula midline.  Eyes:     Pupils: Pupils are equal, round, and reactive to light.  Neck:     Thyroid: No thyromegaly.  Cardiovascular:     Rate and Rhythm: Normal rate and regular rhythm.     Heart sounds: No murmur heard.  No friction rub. No gallop.   Pulmonary:     Effort: Pulmonary effort is normal. No respiratory distress.     Breath sounds: Normal breath sounds. No wheezing.  Chest:     Breasts:        Right: No mass, skin  change or tenderness.        Left: No mass, skin change or tenderness.  Abdominal:     General: Bowel sounds are normal. There is no distension.     Palpations: Abdomen is soft.     Tenderness: There is no abdominal tenderness. There is no rebound.  Musculoskeletal:        General: Normal range of motion.     Cervical back: Normal range of motion and neck supple.  Neurological:     Mental Status: She is alert and oriented to person, place, and time.  Cranial Nerves: No cranial nerve deficit.  Skin:    General: Skin is warm and dry.  Psychiatric:        Judgment: Judgment normal.  Vitals reviewed.     Assessment: Annual Exam 1. Women's annual routine gynecological examination   2. Screen for colon cancer   3. Screening for cervical cancer     Plan:            1.  Cervical Screening-  Pap smear done today  2. Breast screening- Exam annually and mammogram scheduled  3. Colonoscopy every 10 years, Hemoccult testing after age 69  4. Labs managed by PCP  5. Counseling for hormonal therapy: none              6. FRAX - FRAX score for assessing the 10 year probability for fracture calculated and discussed today.  Based on age and score today, DEXA is not currently scheduled.    F/U  Return in about 1 year (around 01/01/2021) for Annual.  Barnett Applebaum, MD, [facility], [facility] 01/02/2020  2:02 PM

## 2020-01-07 LAB — CYTOLOGY - PAP
Comment: NEGATIVE
Diagnosis: NEGATIVE
High risk HPV: NEGATIVE

## 2020-01-14 DIAGNOSIS — D649 Anemia, unspecified: Secondary | ICD-10-CM | POA: Diagnosis not present

## 2020-01-14 DIAGNOSIS — E785 Hyperlipidemia, unspecified: Secondary | ICD-10-CM | POA: Diagnosis not present

## 2020-01-21 DIAGNOSIS — D649 Anemia, unspecified: Secondary | ICD-10-CM | POA: Diagnosis not present

## 2020-01-21 DIAGNOSIS — M5136 Other intervertebral disc degeneration, lumbar region: Secondary | ICD-10-CM | POA: Diagnosis not present

## 2020-01-21 DIAGNOSIS — R600 Localized edema: Secondary | ICD-10-CM | POA: Diagnosis not present

## 2020-01-21 DIAGNOSIS — R1013 Epigastric pain: Secondary | ICD-10-CM | POA: Diagnosis not present

## 2020-01-22 ENCOUNTER — Encounter: Payer: 59 | Admitting: Dermatology

## 2020-02-12 ENCOUNTER — Other Ambulatory Visit: Payer: Self-pay | Admitting: Podiatry

## 2020-02-12 DIAGNOSIS — M722 Plantar fascial fibromatosis: Secondary | ICD-10-CM | POA: Diagnosis not present

## 2020-02-13 ENCOUNTER — Other Ambulatory Visit: Payer: Self-pay | Admitting: Physical Medicine & Rehabilitation

## 2020-02-13 DIAGNOSIS — G8929 Other chronic pain: Secondary | ICD-10-CM | POA: Diagnosis not present

## 2020-02-13 DIAGNOSIS — M5441 Lumbago with sciatica, right side: Secondary | ICD-10-CM | POA: Diagnosis not present

## 2020-02-13 DIAGNOSIS — M542 Cervicalgia: Secondary | ICD-10-CM | POA: Diagnosis not present

## 2020-02-13 DIAGNOSIS — M5412 Radiculopathy, cervical region: Secondary | ICD-10-CM | POA: Diagnosis not present

## 2020-02-14 ENCOUNTER — Other Ambulatory Visit: Payer: Self-pay | Admitting: Internal Medicine

## 2020-02-20 ENCOUNTER — Other Ambulatory Visit: Payer: Self-pay

## 2020-02-20 ENCOUNTER — Ambulatory Visit
Admission: RE | Admit: 2020-02-20 | Discharge: 2020-02-20 | Disposition: A | Discharge status: Home / Self Care | Payer: 59 | Source: Ambulatory Visit | Attending: Physical Medicine & Rehabilitation | Admitting: Physical Medicine & Rehabilitation

## 2020-02-20 DIAGNOSIS — M5412 Radiculopathy, cervical region: Secondary | ICD-10-CM | POA: Diagnosis not present

## 2020-02-20 DIAGNOSIS — M542 Cervicalgia: Secondary | ICD-10-CM | POA: Diagnosis not present

## 2020-02-28 ENCOUNTER — Other Ambulatory Visit: Payer: Self-pay | Admitting: Physical Medicine & Rehabilitation

## 2020-02-28 DIAGNOSIS — M542 Cervicalgia: Secondary | ICD-10-CM | POA: Diagnosis not present

## 2020-02-28 DIAGNOSIS — M4802 Spinal stenosis, cervical region: Secondary | ICD-10-CM | POA: Diagnosis not present

## 2020-02-28 DIAGNOSIS — M5412 Radiculopathy, cervical region: Secondary | ICD-10-CM | POA: Diagnosis not present

## 2020-03-06 ENCOUNTER — Other Ambulatory Visit: Payer: Self-pay | Admitting: Podiatry

## 2020-03-06 DIAGNOSIS — M722 Plantar fascial fibromatosis: Secondary | ICD-10-CM | POA: Diagnosis not present

## 2020-03-06 DIAGNOSIS — M79672 Pain in left foot: Secondary | ICD-10-CM | POA: Diagnosis not present

## 2020-03-06 DIAGNOSIS — M79671 Pain in right foot: Secondary | ICD-10-CM | POA: Diagnosis not present

## 2020-03-07 DIAGNOSIS — M4802 Spinal stenosis, cervical region: Secondary | ICD-10-CM | POA: Diagnosis not present

## 2020-03-07 DIAGNOSIS — M5412 Radiculopathy, cervical region: Secondary | ICD-10-CM | POA: Diagnosis not present

## 2020-03-12 ENCOUNTER — Encounter: Payer: 59 | Admitting: Dermatology

## 2020-03-19 ENCOUNTER — Other Ambulatory Visit: Payer: Self-pay | Admitting: Rheumatology

## 2020-03-19 DIAGNOSIS — M2550 Pain in unspecified joint: Secondary | ICD-10-CM | POA: Diagnosis not present

## 2020-03-19 DIAGNOSIS — M4722 Other spondylosis with radiculopathy, cervical region: Secondary | ICD-10-CM | POA: Diagnosis not present

## 2020-03-19 DIAGNOSIS — R768 Other specified abnormal immunological findings in serum: Secondary | ICD-10-CM | POA: Diagnosis not present

## 2020-03-19 DIAGNOSIS — M8949 Other hypertrophic osteoarthropathy, multiple sites: Secondary | ICD-10-CM | POA: Diagnosis not present

## 2020-03-21 ENCOUNTER — Other Ambulatory Visit: Payer: Self-pay | Admitting: Physical Medicine & Rehabilitation

## 2020-03-21 DIAGNOSIS — M542 Cervicalgia: Secondary | ICD-10-CM | POA: Diagnosis not present

## 2020-03-21 DIAGNOSIS — M5412 Radiculopathy, cervical region: Secondary | ICD-10-CM | POA: Diagnosis not present

## 2020-03-21 DIAGNOSIS — M4802 Spinal stenosis, cervical region: Secondary | ICD-10-CM | POA: Diagnosis not present

## 2020-03-21 DIAGNOSIS — M5441 Lumbago with sciatica, right side: Secondary | ICD-10-CM | POA: Diagnosis not present

## 2020-03-28 DIAGNOSIS — M4802 Spinal stenosis, cervical region: Secondary | ICD-10-CM | POA: Diagnosis not present

## 2020-03-28 DIAGNOSIS — M5412 Radiculopathy, cervical region: Secondary | ICD-10-CM | POA: Diagnosis not present

## 2020-03-28 DIAGNOSIS — M542 Cervicalgia: Secondary | ICD-10-CM | POA: Diagnosis not present

## 2020-04-02 ENCOUNTER — Other Ambulatory Visit: Payer: Self-pay | Admitting: Rheumatology

## 2020-04-02 DIAGNOSIS — I878 Other specified disorders of veins: Secondary | ICD-10-CM | POA: Diagnosis not present

## 2020-04-02 DIAGNOSIS — M5136 Other intervertebral disc degeneration, lumbar region: Secondary | ICD-10-CM | POA: Diagnosis not present

## 2020-04-02 DIAGNOSIS — M533 Sacrococcygeal disorders, not elsewhere classified: Secondary | ICD-10-CM | POA: Diagnosis not present

## 2020-04-02 DIAGNOSIS — Z111 Encounter for screening for respiratory tuberculosis: Secondary | ICD-10-CM | POA: Diagnosis not present

## 2020-04-02 DIAGNOSIS — M47816 Spondylosis without myelopathy or radiculopathy, lumbar region: Secondary | ICD-10-CM | POA: Diagnosis not present

## 2020-04-02 DIAGNOSIS — Z79899 Other long term (current) drug therapy: Secondary | ICD-10-CM | POA: Diagnosis not present

## 2020-04-02 DIAGNOSIS — M459 Ankylosing spondylitis of unspecified sites in spine: Secondary | ICD-10-CM | POA: Diagnosis not present

## 2020-04-03 ENCOUNTER — Ambulatory Visit: Payer: 59 | Admitting: Dermatology

## 2020-04-03 ENCOUNTER — Encounter: Payer: Self-pay | Admitting: Dermatology

## 2020-04-03 ENCOUNTER — Other Ambulatory Visit: Payer: Self-pay

## 2020-04-03 ENCOUNTER — Other Ambulatory Visit: Payer: Self-pay | Admitting: Internal Medicine

## 2020-04-03 DIAGNOSIS — L821 Other seborrheic keratosis: Secondary | ICD-10-CM | POA: Diagnosis not present

## 2020-04-03 DIAGNOSIS — L700 Acne vulgaris: Secondary | ICD-10-CM

## 2020-04-03 DIAGNOSIS — D1800 Hemangioma unspecified site: Secondary | ICD-10-CM

## 2020-04-03 DIAGNOSIS — L814 Other melanin hyperpigmentation: Secondary | ICD-10-CM | POA: Diagnosis not present

## 2020-04-03 DIAGNOSIS — I781 Nevus, non-neoplastic: Secondary | ICD-10-CM | POA: Diagnosis not present

## 2020-04-03 DIAGNOSIS — L578 Other skin changes due to chronic exposure to nonionizing radiation: Secondary | ICD-10-CM

## 2020-04-03 DIAGNOSIS — L820 Inflamed seborrheic keratosis: Secondary | ICD-10-CM | POA: Diagnosis not present

## 2020-04-03 DIAGNOSIS — Z1283 Encounter for screening for malignant neoplasm of skin: Secondary | ICD-10-CM | POA: Diagnosis not present

## 2020-04-03 DIAGNOSIS — D229 Melanocytic nevi, unspecified: Secondary | ICD-10-CM | POA: Diagnosis not present

## 2020-04-03 NOTE — Progress Notes (Signed)
Follow-Up Visit   Subjective  Paige Robles is a 55 y.o. female who presents for the following: Annual Exam (Total body exam today. No hx of skin cancer. Hx of multiple dysplastic nevi. Pt has a spot on her right chest that she would like checked today. ). Patient here for full body skin exam and skin cancer screening.  The following portions of the chart were reviewed this encounter and updated as appropriate:  Tobacco  Allergies  Meds  Problems  Med Hx  Surg Hx  Fam Hx     Review of Systems: No other skin or systemic complaints except as noted in HPI or Assessment and Plan.  Objective  Well appearing patient in no apparent distress; mood and affect are within normal limits.  A full examination was performed including scalp, head, eyes, ears, nose, lips, neck, chest, axillae, abdomen, back, buttocks, bilateral upper extremities, bilateral lower extremities, hands, feet, fingers, toes, fingernails, and toenails. All findings within normal limits unless otherwise noted below.  Objective  Right Chest: Erythematous keratotic or waxy stuck-on papule or plaque.   Objective  Head - Anterior (Face): Erythematous papules and pustules with comedones   Assessment & Plan  Inflamed seborrheic keratosis Right Chest Prior to procedure, discussed risks of blister formation, small wound, skin dyspigmentation, or rare scar following cryotherapy.   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 30 g, 1 years refills.   Lentigines - Scattered tan macules - Due to sun exposure - Benign-appering, observe - Recommend daily broad spectrum sunscreen SPF 30+ to sun-exposed areas, reapply every 2 hours as needed. - Call for any changes  Seborrheic Keratoses - Stuck-on, waxy, tan-brown papules and plaques  - Discussed benign etiology and prognosis. - Observe - Call for any changes  Melanocytic Nevi - Tan-brown and/or pink-flesh-colored symmetric macules and papules - Benign appearing on exam today - Observation - Call clinic for new or changing moles - Recommend daily use of broad spectrum spf 30+ sunscreen to sun-exposed areas.   Hemangiomas - Red papules - Discussed benign nature - Observe - Call for any changes  Actinic Damage - Chronic, secondary to cumulative UV/sun exposure - diffuse scaly erythematous macules with underlying dyspigmentation - Recommend daily broad spectrum sunscreen SPF 30+ to sun-exposed areas, reapply every 2 hours as needed.  - Call for new or changing lesions.  Telangiectasia - Dilated blood vessel - Benign appearing on exam - Call for changes - Discussed BBL laser. $350/treatment. Pt will schedule in future.   Skin cancer screening performed today.  No follow-ups on file.  Patient may be getting different insurance that may require her to follow-up with the physician group.  She will schedule with [place] if she can in the future.  IHarriett Sine, [name], am acting as scribe for Sarina Ser, MD.  Documentation: I have reviewed the above documentation for accuracy and completeness, and I agree with the above.  Sarina Ser, MD

## 2020-04-03 NOTE — Patient Instructions (Signed)
Instructions for [facility] Medications  One or more of your medications was sent to the [facility] mail order compounding pharmacy. You will receive an email from them and can purchase the medicine through that link. It will then be mailed to your home at the address you confirmed. If for any reason you do not receive an email from them, please check your spam folder. If you still do not find the email, please let us know. [facility] phone number is [phone].   

## 2020-04-04 ENCOUNTER — Other Ambulatory Visit: Payer: Self-pay | Admitting: Internal Medicine

## 2020-04-10 ENCOUNTER — Other Ambulatory Visit: Payer: Self-pay | Admitting: Physical Medicine & Rehabilitation

## 2020-04-10 DIAGNOSIS — M542 Cervicalgia: Secondary | ICD-10-CM | POA: Diagnosis not present

## 2020-04-10 DIAGNOSIS — M5441 Lumbago with sciatica, right side: Secondary | ICD-10-CM | POA: Diagnosis not present

## 2020-04-10 DIAGNOSIS — M5412 Radiculopathy, cervical region: Secondary | ICD-10-CM | POA: Diagnosis not present

## 2020-04-10 DIAGNOSIS — M4802 Spinal stenosis, cervical region: Secondary | ICD-10-CM | POA: Diagnosis not present

## 2020-04-11 ENCOUNTER — Other Ambulatory Visit: Payer: Self-pay | Admitting: Internal Medicine

## 2020-04-14 ENCOUNTER — Encounter: Payer: Self-pay | Admitting: Dermatology

## 2020-04-14 ENCOUNTER — Other Ambulatory Visit: Payer: Self-pay | Admitting: Rheumatology

## 2020-04-14 DIAGNOSIS — H5213 Myopia, bilateral: Secondary | ICD-10-CM | POA: Diagnosis not present

## 2020-04-15 ENCOUNTER — Other Ambulatory Visit: Payer: Self-pay

## 2020-04-15 MED ORDER — METRONIDAZOLE 0.75 % EX CREA: TOPICAL_CREAM | Freq: Two times a day (BID) | CUTANEOUS | 3 refills | Status: DC

## 2020-04-15 NOTE — Progress Notes (Signed)
Sent in Metronidazole 0.75% per patient's request

## 2020-04-21 DIAGNOSIS — M5412 Radiculopathy, cervical region: Secondary | ICD-10-CM | POA: Diagnosis not present

## 2020-04-21 DIAGNOSIS — M4802 Spinal stenosis, cervical region: Secondary | ICD-10-CM | POA: Diagnosis not present

## 2020-04-21 DIAGNOSIS — M542 Cervicalgia: Secondary | ICD-10-CM | POA: Diagnosis not present

## 2020-05-01 ENCOUNTER — Other Ambulatory Visit: Payer: Self-pay | Admitting: Physical Medicine & Rehabilitation

## 2020-05-01 DIAGNOSIS — M5412 Radiculopathy, cervical region: Secondary | ICD-10-CM | POA: Diagnosis not present

## 2020-05-01 DIAGNOSIS — M542 Cervicalgia: Secondary | ICD-10-CM | POA: Diagnosis not present

## 2020-05-01 DIAGNOSIS — M9931 Osseous stenosis of neural canal of cervical region: Secondary | ICD-10-CM | POA: Diagnosis not present

## 2020-05-07 ENCOUNTER — Other Ambulatory Visit (HOSPITAL_COMMUNITY): Payer: Self-pay

## 2020-05-07 MED ORDER — HUMIRA PEN 40 MG/0.8ML ~~LOC~~ PNKT
PEN_INJECTOR | SUBCUTANEOUS | 12 refills | Status: DC
  Filled 2020-05-07 (×2): qty 2, 28d supply, fill #0

## 2020-05-08 ENCOUNTER — Other Ambulatory Visit (HOSPITAL_COMMUNITY): Payer: Self-pay

## 2020-05-08 ENCOUNTER — Ambulatory Visit: Payer: 59 | Attending: Internal Medicine | Admitting: Pharmacist

## 2020-05-08 ENCOUNTER — Other Ambulatory Visit: Payer: Self-pay

## 2020-05-08 DIAGNOSIS — Z79899 Other long term (current) drug therapy: Secondary | ICD-10-CM

## 2020-05-08 MED ORDER — HUMIRA PEN 40 MG/0.8ML ~~LOC~~ PNKT
PEN_INJECTOR | SUBCUTANEOUS | 12 refills | Status: AC
  Filled 2020-05-08: qty 2, 28d supply, fill #0
  Filled 2020-05-08: qty 2, fill #0
  Filled 2020-05-27 – 2020-05-30 (×2): qty 2, 28d supply, fill #1

## 2020-05-08 NOTE — Progress Notes (Signed)
S: Patient presents for review of their specialty medication therapy.  Patient is about to start Humira for ankylosing spondylitis. Patient is managed by Dr. Posey Pronto  for this.   Adherence: has not yet started   Efficacy: has not yet started   Dosing:  Ankylosing spondylitis: SubQ: 40 mg every other week (may continue methotrexate, other nonbiologic DMARDS, corticosteroids, NSAIDs and/or analgesics)  Dose adjustments: Renal: no dose adjustments (has not been studied) Hepatic: no dose adjustments (has not been studied)  Drug-drug interactions: none identified   Screening: TB test: completed  Hepatitis: completed   Monitoring: S/sx of infection: none  CBC: Followed by [facility] S/sx of hypersensitivity: has not yet started, counseling given S/sx of malignancy: has not yet started, counseling given S/sx of heart failure: has not yet started, counseling given  Other side effects: has not yet started, counseling given  O:     Lab Results  Component Value Date   WBC 6.8 01/13/2018   HGB 11.1 (L) 01/13/2018   HCT 35.0 (L) 01/13/2018   MCV 87.3 01/13/2018   PLT 399 01/13/2018      Chemistry      Component Value Date/Time   NA 131 (L) 07/14/2015 2136   NA 140 09/14/2011 1323   K 3.1 (L) 07/14/2015 2136   K 4.4 09/14/2011 1323   CL 89 (L) 07/14/2015 2136   CL 103 09/14/2011 1323   CO2 31 07/14/2015 2136   CO2 30 09/14/2011 1323   BUN 7 07/14/2015 2136   BUN 13 09/14/2011 1323   CREATININE 0.68 07/14/2015 2136   CREATININE 0.67 09/14/2011 1323      Component Value Date/Time   CALCIUM 9.4 07/14/2015 2136   ALKPHOS 89 07/14/2015 1936   AST 20 07/14/2015 1936   AST 14 (L) 09/14/2011 1323   ALT 14 07/14/2015 1936   ALT 14 09/14/2011 1323   BILITOT 0.8 07/14/2015 1936       A/P: 1. Medication review: Patient is about to start Humira for ankylosing spondylitis. Reviewed the medication with the patient, including the following: Humira is a TNF blocking agent indicated  for ankylosing spondylitis, Crohn's disease, Hidradenitis suppurativa, psoriatic arthritis, plaque psoriasis, ulcerative colitis, and uveitis. Patient educated on purpose, proper use and potential adverse effects of Humira. Possible adverse effects are increased risk of infections, headache, and injection site reactions. There is the possibility of an increased risk of malignancy but it is not well understood if this increased risk is due to there medication or the disease state. There are rare cases of pancytopenia and aplastic anemia. For SubQ injection at separate sites in the thigh or lower abdomen (avoiding areas within 2 inches of navel); rotate injection sites. May leave at room temperature for ~15 to 30 minutes prior to use; do not remove cap or cover while allowing product to reach room temperature. Do not use if solution is discolored or contains particulate matter. Do not administer to skin which is red, tender, bruised, hard, or that has scars, stretch marks, or psoriasis plaques. Needle cap of the prefilled syringe or needle cover for the adalimumab pen may contain latex. Prefilled pens and syringes are available for use by patients and the full amount of the syringe should be injected (self-administration); the vial is intended for institutional use only. Vials do not contain a preservative; discard unused portion. No recommendations for any changes at this time.  Benard Halsted, PharmD, [facility], [place] [phone]

## 2020-05-09 ENCOUNTER — Other Ambulatory Visit (HOSPITAL_COMMUNITY): Payer: Self-pay

## 2020-05-09 ENCOUNTER — Ambulatory Visit: Payer: 59

## 2020-05-09 NOTE — Progress Notes (Signed)
   Covid-19 Vaccination Clinic  Name:  Paige Robles    MRN: [id number] DOB: 10/12/1965  05/09/2020  Paige Robles was observed post Covid-19 immunization for 15 minutes without incident. She was provided with Vaccine Information Sheet and instruction to access the V-Safe system.   Paige Robles was instructed to call 911 with any severe reactions post vaccine: Marland Kitchen Difficulty breathing  . Swelling of face and throat  . A fast heartbeat  . A bad rash all over body  . Dizziness and weakness

## 2020-05-14 ENCOUNTER — Other Ambulatory Visit: Payer: Self-pay

## 2020-05-14 MED ORDER — MODERNA COVID-19 VACCINE 100 MCG/0.5ML IM SUSP
INTRAMUSCULAR | 0 refills | Status: AC
  Filled 2020-05-14: qty 0.25, 20d supply, fill #0

## 2020-05-15 ENCOUNTER — Other Ambulatory Visit: Payer: Self-pay

## 2020-05-15 MED ORDER — GABAPENTIN 100 MG PO CAPS
ORAL_CAPSULE | ORAL | 3 refills | Status: AC
  Filled 2020-05-15 – 2020-05-23 (×2): qty 90, 30d supply, fill #0

## 2020-05-23 ENCOUNTER — Other Ambulatory Visit: Payer: Self-pay

## 2020-05-27 ENCOUNTER — Other Ambulatory Visit (HOSPITAL_COMMUNITY): Payer: Self-pay

## 2020-05-30 ENCOUNTER — Other Ambulatory Visit (HOSPITAL_COMMUNITY): Payer: Self-pay

## 2020-06-09 ENCOUNTER — Encounter: Payer: 59 | Admitting: Dermatology

## 2020-06-12 ENCOUNTER — Ambulatory Visit: Payer: 59 | Admitting: Dermatology

## 2020-06-23 ENCOUNTER — Other Ambulatory Visit (HOSPITAL_COMMUNITY): Payer: Self-pay

## 2020-07-03 ENCOUNTER — Ambulatory Visit: Payer: 59 | Admitting: Dermatology

## 2020-07-08 ENCOUNTER — Other Ambulatory Visit: Payer: Self-pay

## 2020-07-10 ENCOUNTER — Other Ambulatory Visit: Payer: Self-pay

## 2020-07-11 ENCOUNTER — Ambulatory Visit (INDEPENDENT_AMBULATORY_CARE_PROVIDER_SITE_OTHER): Payer: Self-pay | Admitting: Dermatology

## 2020-07-11 ENCOUNTER — Other Ambulatory Visit: Payer: Self-pay

## 2020-07-11 DIAGNOSIS — I781 Nevus, non-neoplastic: Secondary | ICD-10-CM

## 2020-07-11 NOTE — Patient Instructions (Signed)

## 2020-07-11 NOTE — Progress Notes (Signed)
   Follow-Up Visit   Subjective  Paige Robles is a 55 y.o. female who presents for the following: Telangiectasias (Face, pt presents for BBL today). The following portions of the chart were reviewed this encounter and updated as appropriate:   Tobacco  Allergies  Meds  Problems  Med Hx  Surg Hx  Fam Hx      Review of Systems:  No other skin or systemic complaints except as noted in HPI or Assessment and Plan.  Objective  Well appearing patient in no apparent distress; mood and affect are within normal limits.  A focused examination was performed including face. Relevant physical exam findings are noted in the Assessment and Plan.  chin, nose, cheeks Dilated vessels                                  Assessment & Plan  Telangiectasias chin, nose, cheeks  Discussed may take several treatments for best results.  Photorejuvenation - chin, nose, cheeks Prior to the procedure, the patient's past medical history, medications, allergies, and the rare but potential risks and complications were reviewed with the patient and a signed consent was obtained.  Pre and post treatment care was discussed and instructions provided.    Patient tolerated the procedure well.   Nancy Fetter avoidance was stressed. The patient will call with any problems, questions or concerns prior to their next appointment.    Return for as scheduled for TBSE.  I, Othelia Pulling, RMA, am acting as scribe for Sarina Ser, MD . Documentation: I have reviewed the above documentation for accuracy and completeness, and I agree with the above.  Sarina Ser, MD

## 2020-07-15 ENCOUNTER — Encounter: Payer: Self-pay | Admitting: Dermatology

## 2020-07-23 ENCOUNTER — Other Ambulatory Visit: Payer: Self-pay

## 2020-07-23 MED ORDER — ONDANSETRON 4 MG PO TBDP
ORAL_TABLET | ORAL | 5 refills | Status: AC
  Filled 2020-07-23: qty 9, 3d supply, fill #0

## 2020-07-23 MED ORDER — CHOLESTYRAMINE 4 G PO PACK
PACK | ORAL | 11 refills | Status: DC
  Filled 2020-07-23 (×2): qty 30, 30d supply, fill #0

## 2020-07-24 ENCOUNTER — Other Ambulatory Visit: Payer: Self-pay

## 2020-10-03 ENCOUNTER — Other Ambulatory Visit (HOSPITAL_BASED_OUTPATIENT_CLINIC_OR_DEPARTMENT_OTHER): Payer: Self-pay

## 2020-11-07 ENCOUNTER — Other Ambulatory Visit: Payer: Self-pay

## 2020-11-07 MED ORDER — PAXLOVID (300/100) 20 X 150 MG & 10 X 100MG PO TBPK
ORAL_TABLET | ORAL | 0 refills | Status: AC
  Filled 2020-11-07: qty 30, 5d supply, fill #0

## 2020-11-07 MED ORDER — BENZONATATE 200 MG PO CAPS
ORAL_CAPSULE | ORAL | 0 refills | Status: AC
  Filled 2020-11-07: qty 20, 7d supply, fill #0

## 2020-12-15 ENCOUNTER — Other Ambulatory Visit (HOSPITAL_COMMUNITY): Payer: Self-pay

## 2021-01-09 ENCOUNTER — Other Ambulatory Visit: Payer: Self-pay

## 2021-01-09 MED ORDER — PREDNISOLONE ACETATE 1 % OP SUSP
OPHTHALMIC | 1 refills | Status: AC
  Filled 2021-01-09: qty 5, 7d supply, fill #0

## 2021-02-18 ENCOUNTER — Other Ambulatory Visit: Payer: Self-pay | Admitting: Obstetrics and Gynecology

## 2021-02-18 DIAGNOSIS — Z1231 Encounter for screening mammogram for malignant neoplasm of breast: Secondary | ICD-10-CM

## 2021-03-13 ENCOUNTER — Other Ambulatory Visit: Payer: Self-pay | Admitting: Physical Medicine & Rehabilitation

## 2021-03-13 ENCOUNTER — Other Ambulatory Visit (HOSPITAL_COMMUNITY): Payer: Self-pay | Admitting: Physical Medicine & Rehabilitation

## 2021-03-13 DIAGNOSIS — G8929 Other chronic pain: Secondary | ICD-10-CM

## 2021-03-23 ENCOUNTER — Other Ambulatory Visit: Payer: Self-pay

## 2021-03-23 ENCOUNTER — Ambulatory Visit
Admission: RE | Admit: 2021-03-23 | Discharge: 2021-03-23 | Disposition: A | Discharge status: Home / Self Care | Payer: 59 | Source: Ambulatory Visit | Attending: Physical Medicine & Rehabilitation | Admitting: Physical Medicine & Rehabilitation

## 2021-03-23 DIAGNOSIS — G8929 Other chronic pain: Secondary | ICD-10-CM | POA: Diagnosis present

## 2021-03-23 DIAGNOSIS — M5441 Lumbago with sciatica, right side: Secondary | ICD-10-CM | POA: Insufficient documentation

## 2021-03-27 ENCOUNTER — Other Ambulatory Visit: Payer: Self-pay

## 2021-03-27 ENCOUNTER — Ambulatory Visit
Admission: RE | Admit: 2021-03-27 | Discharge: 2021-03-27 | Disposition: A | Discharge status: Home / Self Care | Payer: 59 | Source: Ambulatory Visit | Attending: Obstetrics and Gynecology | Admitting: Obstetrics and Gynecology

## 2021-03-27 DIAGNOSIS — Z1231 Encounter for screening mammogram for malignant neoplasm of breast: Secondary | ICD-10-CM | POA: Insufficient documentation

## 2021-04-09 ENCOUNTER — Encounter: Payer: 59 | Admitting: Dermatology

## 2021-04-29 ENCOUNTER — Ambulatory Visit: Payer: Self-pay | Admitting: Dermatology

## 2021-05-14 ENCOUNTER — Ambulatory Visit (INDEPENDENT_AMBULATORY_CARE_PROVIDER_SITE_OTHER): Payer: Self-pay | Admitting: Dermatology

## 2021-05-14 DIAGNOSIS — L814 Other melanin hyperpigmentation: Secondary | ICD-10-CM

## 2021-05-14 DIAGNOSIS — B079 Viral wart, unspecified: Secondary | ICD-10-CM

## 2021-05-14 DIAGNOSIS — L821 Other seborrheic keratosis: Secondary | ICD-10-CM

## 2021-05-14 DIAGNOSIS — L719 Rosacea, unspecified: Secondary | ICD-10-CM

## 2021-05-14 DIAGNOSIS — I781 Nevus, non-neoplastic: Secondary | ICD-10-CM

## 2021-05-14 MED ORDER — VEREGEN 15 % EX OINT: 1.0000 "application " | TOPICAL_OINTMENT | Freq: Every day | CUTANEOUS | 3 refills | Status: AC

## 2021-05-14 NOTE — Progress Notes (Signed)
? ?Follow-Up Visit ?  ?Subjective  ?Paige Robles is a 56 y.o. female who presents for the following: Rosacea (Face, BBL today) and lentigos (Face, hands, BBL today). ? ?The following portions of the chart were reviewed this encounter and updated as appropriate:  ? Tobacco  Allergies  Meds  Problems  Med Hx  Surg Hx  Fam Hx   ?  ?Review of Systems:  No other skin or systemic complaints except as noted in HPI or Assessment and Plan. ? ?Objective  ?Well appearing patient in no apparent distress; mood and affect are within normal limits. ? ?A focused examination was performed including face. Relevant physical exam findings are noted in the Assessment and Plan. ? ?chest x 2 (2) ?Stuck-on, waxy, tan-brown papules and plaques -- Discussed benign etiology and prognosis.  ? ?face, hands ?Brown macules face, hands ? ? ? ? ? ? ?face ?Telangiectasias face ? ? ? ? ? ? ? ? ? ? ? ? ? ? ? ? ? ?Assessment & Plan  ?Seborrheic keratosis (2) ?chest x 2 ?Discussed cosmetic procedure, noncovered.  $60 for 1st lesion and $15 for each additional lesion if done on the same day.  Maximum charge $350.  One touch-up treatment included no charge. Discussed risks of treatment including dyspigmentation, small scar, and/or recurrence. Recommend daily broad spectrum sunscreen SPF 30+/photoprotection to treated areas once healed.  ? ?Destruction of lesion - chest x 2 ?Complexity: simple   ?Destruction method: cryotherapy   ?Informed consent: discussed and consent obtained   ?Timeout:  patient name, date of birth, surgical site, and procedure verified ?Lesion destroyed using liquid nitrogen: Yes   ?Region frozen until ice ball extended beyond lesion: Yes   ?Outcome: patient tolerated procedure well with no complications   ?Post-procedure details: wound care instructions given   ? ?Lentigines ?face, hands ?BBL today ?Photorejuvenation - face, hands ?Prior to the procedure, the patient's past medical history, medications, allergies, and  the rare but potential risks and complications were reviewed with the patient and a signed consent was obtained.  Pre and post treatment care was discussed and instructions provided. ? ? [place] BBL - 05/14/21 1400   ?  ? Patient Details  ? Skin Type: II   ? Anesthestic Cream Applied: No   ? Photo Takes: Yes   ? Consent Signed: Yes   ?  ? Treatment Details  ? Date: 05/14/21   ? Treatment #: 2   ? Area: face   ? Filter: 1st Pass;2nd Pass;3rd Pass   ?  ?   ? 2nd Pass  ? Location: F   ? Device: 515 filter   ? BBL j/cm2: 15   ? PW Msec Sec: 15   ? Cooling Temp: 15   ? Pulses: 14   ? 53m: used this crystal   ?  ? 3rd Pass  ? Location: Other   hands  ? Device: 515 filter   ? BBL j/cm2: 15   ? PW Msec Sec: 15   ? Cooling Temp: 15   ? Pulses: 40   ? 154m used this crystal   ?  ? Patient tolerated the procedure well.  ? ?SuNancy Fettervoidance was stressed. The patient will call with any problems, questions or concerns prior to their next appointment. ? ?Viral warts, unspecified type ?R 4th finger ?Discussed viral etiology and risk of spread.  Discussed multiple treatments may be required to clear warts.  Discussed possible post-treatment dyspigmentation and risk of recurrence. ? ?Sinecatechins (  VEREGEN) 15 % OINT - R 4th finger ?Apply 1 application. topically at bedtime. Qhs and cover to wart on finger ? ?Rosacea ?face ?Rosacea is a chronic progressive skin condition usually affecting the face of adults, causing redness and/or acne bumps. It is treatable but not curable. It sometimes affects the eyes (ocular rosacea) as well. It may respond to topical and/or systemic medication and can flare with stress, sun exposure, alcohol, exercise and some foods.  Daily application of broad spectrum spf 30+ sunscreen to face is recommended to reduce flares. ? ?BBL today ? ?Photorejuvenation - face ?Prior to the procedure, the patient's past medical history, medications, allergies, and the rare but potential risks and complications were  reviewed with the patient and a signed consent was obtained.  Pre and post treatment care was discussed and instructions provided. ? ? [place] BBL - 05/14/21 1400   ?  ? Patient Details  ? Skin Type: II   ? Anesthestic Cream Applied: No   ? Photo Takes: Yes   ? Consent Signed: Yes   ?  ? Treatment Details  ? Date: 05/14/21   ? Treatment #: 2   ? Area: face   ? Filter: 1st Pass;2nd Pass;3rd Pass   ?  ? 1st Pass  ? Location: F   ? Device: 560 filter   ? BBL j/cm2: 27   ? PW Msec Sec: 25   ? Cooling Temp: 20   ? Pulses: 65   ? 41m: used this crystal   ?  ?Patient tolerated the procedure well.  ? ?[name] was stressed. The patient will call with any problems, questions or concerns prior to their next appointment. ? ?Return in about 2 months (around 07/14/2021) for cosmetic sk f/u, ~62mBL. ? ?I, SoOthelia PullingRMA, am acting as scribe for DaSarina SerMD . ?Documentation: I have reviewed the above documentation for accuracy and completeness, and I agree with the above. ? ?DaSarina SerMD ? ? ? ?

## 2021-05-14 NOTE — Patient Instructions (Signed)

## 2021-05-20 ENCOUNTER — Other Ambulatory Visit: Payer: Self-pay

## 2021-05-20 ENCOUNTER — Telehealth: Payer: Self-pay

## 2021-05-20 MED ORDER — IMIQUIMOD 3.75 % EX CREA
1.0000 "application " | TOPICAL_CREAM | Freq: Every day | CUTANEOUS | 0 refills | Status: AC
  Filled 2021-05-20: qty 28, 30d supply, fill #0

## 2021-05-20 NOTE — Addendum Note (Signed)
Addended by: Johnsie Kindred R on: 05/20/2021 03:30 PM ? ? Modules accepted: Orders ? ?

## 2021-05-20 NOTE — Telephone Encounter (Signed)
VM left with patient regarding medication change. AW to send in Rx.  ?

## 2021-05-20 NOTE — Telephone Encounter (Signed)
Patients Veregen 15% Ointment is denied by patients insurance due to patient has not tried and failed Imiquimod or Podofilox. Please advise.  ?

## 2021-05-21 ENCOUNTER — Other Ambulatory Visit: Payer: Self-pay

## 2021-05-22 ENCOUNTER — Encounter: Payer: Self-pay | Admitting: Dermatology

## 2021-06-09 ENCOUNTER — Ambulatory Visit: Payer: 59 | Admitting: Dermatology

## 2021-07-20 ENCOUNTER — Ambulatory Visit: Payer: 59 | Admitting: Dermatology

## 2021-11-04 ENCOUNTER — Other Ambulatory Visit: Payer: Self-pay

## 2021-11-04 MED ORDER — CHOLESTYRAMINE 4 G PO PACK
PACK | ORAL | 11 refills | Status: DC
  Filled 2021-11-04: qty 30, 30d supply, fill #0
  Filled 2021-12-04: qty 30, 30d supply, fill #1
  Filled 2022-02-11: qty 30, 30d supply, fill #2
  Filled 2022-08-08: qty 30, 30d supply, fill #3
  Filled 2022-09-15: qty 30, 30d supply, fill #4
  Filled 2022-10-26: qty 30, 30d supply, fill #5

## 2021-11-05 ENCOUNTER — Other Ambulatory Visit: Payer: Self-pay

## 2021-12-03 ENCOUNTER — Ambulatory Visit: Payer: 59 | Admitting: Dermatology

## 2021-12-04 ENCOUNTER — Other Ambulatory Visit: Payer: Self-pay

## 2021-12-07 ENCOUNTER — Other Ambulatory Visit: Payer: Self-pay

## 2021-12-11 ENCOUNTER — Ambulatory Visit: Payer: 59 | Admitting: Dermatology

## 2022-02-11 ENCOUNTER — Other Ambulatory Visit: Payer: Self-pay

## 2022-02-12 ENCOUNTER — Other Ambulatory Visit: Payer: Self-pay

## 2022-03-02 ENCOUNTER — Other Ambulatory Visit: Payer: Self-pay | Admitting: Obstetrics and Gynecology

## 2022-03-02 ENCOUNTER — Ambulatory Visit: Payer: 59 | Admitting: Dermatology

## 2022-03-02 DIAGNOSIS — Z1231 Encounter for screening mammogram for malignant neoplasm of breast: Secondary | ICD-10-CM

## 2022-03-29 ENCOUNTER — Ambulatory Visit (INDEPENDENT_AMBULATORY_CARE_PROVIDER_SITE_OTHER): Payer: 59 | Admitting: Dermatology

## 2022-03-29 VITALS — BP 131/80 | HR 96

## 2022-03-29 DIAGNOSIS — L719 Rosacea, unspecified: Secondary | ICD-10-CM

## 2022-03-29 DIAGNOSIS — B078 Other viral warts: Secondary | ICD-10-CM

## 2022-03-29 DIAGNOSIS — L814 Other melanin hyperpigmentation: Secondary | ICD-10-CM

## 2022-03-29 DIAGNOSIS — L821 Other seborrheic keratosis: Secondary | ICD-10-CM

## 2022-03-29 NOTE — Patient Instructions (Addendum)
Cryotherapy Aftercare  Wash gently with soap and water everyday.   Apply Vaseline and Band-Aid daily until healed.   Counseling for BBL / IPL / Laser and Coordination of Care Discussed the treatment option of Broad Band Light (BBL) /Intense Pulsed Light (IPL)/ Laser for skin discoloration, including brown spots and redness.  Typically we recommend at least 1-3 treatment sessions about 5-8 weeks apart for best results.  Cannot have tanned skin when BBL performed, and regular use of sunscreen is advised after the procedure to help maintain results. The patient's condition may also require "maintenance treatments" in the future.  The fee for BBL / laser treatments is $350 per treatment session for the whole face.  A fee can be quoted for other parts of the body.  Insurance typically does not pay for BBL/laser treatments and therefore the fee is an out-of-pocket cost.  Due to recent changes in healthcare laws, you may see results of your pathology and/or laboratory studies on MyChart before the doctors have had a chance to review them. We understand that in some cases there may be results that are confusing or concerning to you. Please understand that not all results are received at the same time and often the doctors may need to interpret multiple results in order to provide you with the best plan of care or course of treatment. Therefore, we ask that you please give [place] 2 business days to thoroughly review all your results before contacting the office for clarification. Should we see a critical lab result, you will be contacted sooner.   If You Need Anything After Your Visit  If you have any questions or concerns for your doctor, please call our main line at [phone] and press option 4 to reach your doctor's medical assistant. If no one answers, please leave a voicemail as directed and we will return your call as soon as possible. Messages left after 4 pm will be answered the following business day.    You may also send [place] a message via [place]. We typically respond to MyChart messages within 1-2 business days.  For prescription refills, please ask your pharmacy to contact our office. Our fax number is [phone].  If you have an urgent issue when the clinic is closed that cannot wait until the next business day, you can page your doctor at the number below.    Please note that while we do our best to be available for urgent issues outside of office hours, we are not available 24/7.   If you have an urgent issue and are unable to reach [place], you may choose to seek medical care at your doctor's office, retail clinic, urgent care center, or emergency room.  If you have a medical emergency, please immediately call 911 or go to the emergency department.  Pager Numbers  - Dr. Nehemiah Massed: [phone]  - Dr. Laurence Ferrari: [phone]  - Dr. Nicole Kindred: [phone]  In the event of inclement weather, please call our main line at [phone] for an update on the status of any delays or closures.  Dermatology Medication Tips: Please keep the boxes that topical medications come in in order to help keep track of the instructions about where and how to use these. Pharmacies typically print the medication instructions only on the boxes and not directly on the medication tubes.   If your medication is too expensive, please contact our office at [phone] option 4 or send [place] a message through [place].   We are unable to tell  what your co-pay for medications will be in advance as this is different depending on your insurance coverage. However, we may be able to find a substitute medication at lower cost or fill out paperwork to get insurance to cover a needed medication.   If a prior authorization is required to get your medication covered by your insurance company, please allow [place] 1-2 business days to complete this process.  Drug prices often vary depending on where the prescription is filled and some  pharmacies may offer cheaper prices.  The website www.goodrx.com contains coupons for medications through different pharmacies. The prices here do not account for what the cost may be with help from insurance (it may be cheaper with your insurance), but the website can give you the price if you did not use any insurance.  - You can print the associated coupon and take it with your prescription to the pharmacy.  - You may also stop by our office during regular business hours and pick up a GoodRx coupon card.  - If you need your prescription sent electronically to a different pharmacy, notify our office through [facility] or by phone at [phone] option 4.     Si Usted Necesita Algo Despus de Su Visita  Tambin puede enviarnos un mensaje a travs de Pharmacist, community. Por lo general respondemos a los mensajes de MyChart en el transcurso de 1 a 2 das hbiles.  Para renovar recetas, por favor pida a su farmacia que se ponga en contacto con nuestra oficina. Harland Dingwall de fax es [place] [phone].  Si tiene un asunto urgente cuando la clnica est cerrada y que no puede esperar hasta el siguiente da hbil, puede llamar/localizar a su doctor(a) al nmero que aparece a continuacin.   Por favor, tenga en cuenta que aunque hacemos todo lo posible para estar disponibles para asuntos urgentes fuera del horario de [place], no estamos disponibles las 24 horas del da, los 7 das de la [place].   Si tiene un problema urgente y no puede comunicarse con nosotros, puede optar por buscar atencin mdica  en el consultorio de su doctor(a), en una clnica privada, en un centro de atencin urgente o en una sala de emergencias.  Si tiene Engineering geologist, por favor llame inmediatamente al 911 o vaya a la sala de emergencias.  Nmeros de bper  - Dr. Nehemiah Massed: [phone]  - Dra. Moye: [phone]  - Dra. Nicole Kindred: [phone]  En caso de inclemencias del [place], por favor llame a Johnsie Kindred  principal al [phone] para una actualizacin sobre el [place] de cualquier retraso o cierre.  Consejos para la medicacin en dermatologa: Por favor, guarde las cajas en las que vienen los medicamentos de uso tpico para ayudarle a seguir las instrucciones sobre dnde y cmo usarlos. Las farmacias generalmente imprimen las instrucciones del medicamento slo en las cajas y no directamente en los tubos del [place].   Si su medicamento es muy caro, por favor, pngase en contacto con Zigmund Daniel llamando al [phone] y presione la opcin 4 o envenos un mensaje a travs de Pharmacist, community.   No podemos decirle cul ser su copago por los medicamentos por adelantado ya que esto es diferente dependiendo de la cobertura de su seguro. Sin embargo, es posible que podamos encontrar un medicamento sustituto a Electrical engineer un formulario para que el seguro cubra el medicamento que se considera necesario.   Si se requiere una autorizacin previa para que su compaa de seguros [place] su  medicamento, por favor permtanos de 1 a 2 das hbiles para completar este proceso.  Los precios de los medicamentos varan con frecuencia dependiendo del Environmental consultant de dnde se surte la receta y alguna farmacias pueden ofrecer precios ms baratos.  El sitio web www.goodrx.com tiene cupones para medicamentos de Airline pilot. Los precios aqu no tienen en cuenta lo que podra costar con la ayuda del seguro (puede ser ms barato con su seguro), pero el sitio web puede darle el precio si no utiliz Research scientist (physical sciences).  - Puede imprimir el cupn correspondiente y llevarlo con su receta a la farmacia.  - Tambin puede pasar por nuestra oficina durante el horario de atencin regular y Charity fundraiser una tarjeta de cupones de GoodRx.  - Si necesita que su receta se enve electrnicamente a una farmacia diferente, informe a nuestra oficina a travs de MyChart de  o por telfono llamando al [phone] y presione la opcin  4.

## 2022-03-29 NOTE — Progress Notes (Signed)
Follow-Up Visit   Subjective  Paige Robles is a 57 y.o. female who presents for the following: Procedure (Rosacea, face, BBL today. Lentigos face and hands, BBL today.).    The following portions of the chart were reviewed this encounter and updated as appropriate:       Review of Systems:  No other skin or systemic complaints except as noted in HPI or Assessment and Plan.  Objective  Well appearing patient in no apparent distress; mood and affect are within normal limits.  A focused examination was performed including face, arms, hands. Relevant physical exam findings are noted in the Assessment and Plan.  face Face with telangiectasias.               face, hands Light brown macules.       Left Forearm Stuck-on, waxy, tan-brown papules and plaques -- Discussed benign etiology and prognosis.   Right Hand - Anterior 4 mm Verrucous papule R 4th finger periungual    Assessment & Plan  Rosacea face  Laser kit given to patient today.  Photorejuvenation - face Prior to the procedure, the patient's past medical history, medications, allergies, and the rare but potential risks and complications were reviewed with the patient and a signed consent was obtained.  Pre and post treatment care was discussed and instructions provided.   [place] BBL - 03/29/22 1700      Patient Details   Skin Type: I    Photo Takes: Yes    Consent Signed: Yes    Improvement from Previous Treatment: Yes      Treatment Details   Date: 03/29/22    Treatment #: 3    Area: face and hands    Filter: 1st Pass;2nd Pass      1st Pass   Location: F    Device: 560 Filter    BBL j/cm2: 26    PW Msec Sec: 27    Cooling Temp: 20    Pulses: 34    10m: this crystal used     Patient tolerated the procedure well.   Paige Robles was stressed. The patient will call with any problems, questions or concerns prior to their next appointment.    Lentigines face,  hands    Photorejuvenation - face, hands Prior to the procedure, the patient's past medical history, medications, allergies, and the rare but potential risks and complications were reviewed with the patient and a signed consent was obtained.  Pre and post treatment care was discussed and instructions provided.   [place] BBL - 03/29/22 1700      Patient Details   Skin Type: I    Photo Takes: Yes    Consent Signed: Yes    Improvement from Previous Treatment: Yes      Treatment Details   Date: 03/29/22    Treatment #: 3    Area: face and hands    Filter: 1st Pass;2nd Pass       2nd Pass   Location: Other   Face and hands   Device: 515 Filter    BBL j/cm2: 12    PW Msec Sec: 10    Cooling Temp: 25    Pulses: 144    15x15: used this crystal          Patient tolerated the procedure well.   Paige Robles was stressed. The patient will call with any problems, questions or concerns prior to their next appointment.    Seborrheic keratosis Left Forearm  Discussed  cosmetic procedure, noncovered.  $60 for 1st lesion and $15 for each additional lesion if done on the same day.  Maximum charge $350.  One touch-up treatment included no charge. Discussed risks of treatment including dyspigmentation, small scar, and/or recurrence. Recommend daily broad spectrum sunscreen SPF 30+/photoprotection to treated areas once healed.   Destruction of lesion - Left Forearm  Destruction method: cryotherapy   Informed consent: discussed and consent obtained   Lesion destroyed using liquid nitrogen: Yes   Region frozen until ice ball extended beyond lesion: Yes   Outcome: patient tolerated procedure well with no complications   Post-procedure details: wound care instructions given   Additional details:  Prior to procedure, discussed risks of blister formation, small wound, skin dyspigmentation, or rare scar following cryotherapy. Recommend Vaseline ointment to treated areas while  healing.   Other viral warts Right Hand - Anterior  Viral Wart (HPV) Counseling  Discussed viral / HPV (Human Papilloma Virus) etiology and risk of spread /infectivity to other areas of body as well as to other people.  Multiple treatments and methods may be required to clear warts and it is possible treatment may not be successful.  Treatment risks include discoloration; scarring and there is still potential for wart recurrence.  Destruction of lesion - Right Hand - Anterior  Destruction method: cryotherapy   Informed consent: discussed and consent obtained   Lesion destroyed using liquid nitrogen: Yes   Region frozen until ice ball extended beyond lesion: Yes   Outcome: patient tolerated procedure well with no complications   Post-procedure details: wound care instructions given   Additional details:  Prior to procedure, discussed risks of blister formation, small wound, skin dyspigmentation, or rare scar following cryotherapy. Recommend Vaseline ointment to treated areas while healing.    Return if symptoms worsen or fail to improve.  Paige Robles, [name], am acting as scribe for Paige Patty, MD .  Documentation: I have reviewed the above documentation for accuracy and completeness, and I agree with the above.  Paige Patty MD

## 2022-03-30 ENCOUNTER — Ambulatory Visit
Admission: RE | Admit: 2022-03-30 | Discharge: 2022-03-30 | Disposition: A | Discharge status: Home / Self Care | Payer: 59 | Source: Ambulatory Visit | Attending: Obstetrics and Gynecology | Admitting: Obstetrics and Gynecology

## 2022-03-30 ENCOUNTER — Telehealth: Payer: Self-pay

## 2022-03-30 DIAGNOSIS — Z1231 Encounter for screening mammogram for malignant neoplasm of breast: Secondary | ICD-10-CM | POA: Diagnosis present

## 2022-03-30 NOTE — Telephone Encounter (Signed)
Talked to patient and she has some pinkness and swelling of the face from BBL treatment yesterday, but she is doing ok. She will call back with any questions or concerns.

## 2022-05-16 IMAGING — MR MR CERVICAL SPINE W/O CM
5 series · 37 of 48 positions shown · non-contrast
Comparison: Previous MRI from 11/23/2012.

CLINICAL DATA: Initial evaluation for chronic neck pain with
radiation to the right shoulder and upper extremity.

EXAM:
MRI CERVICAL SPINE WITHOUT CONTRAST
TECHNIQUE: Multiplanar, multisequence MR imaging of the cervical spine was
performed. No intravenous contrast was administered.

[Series 5: T2 · sagittal · 3.0mm · 0.62mm/px · 6 of 15 slices shown (1 of 2)]
[im 1/15]
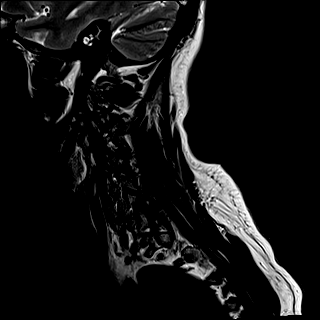
[im 3/15]
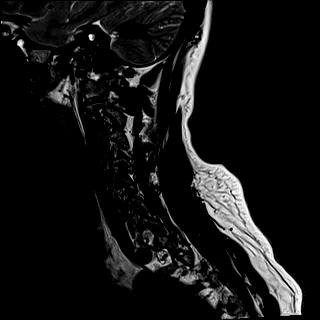
[im 6/15]
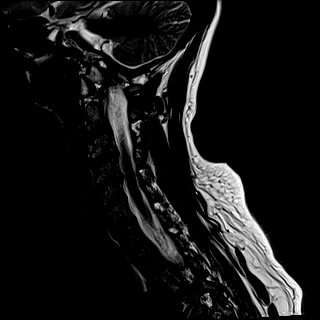
[im 9/15]
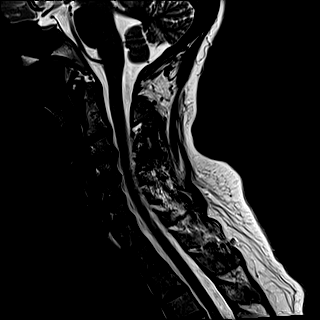
[im 12/15]
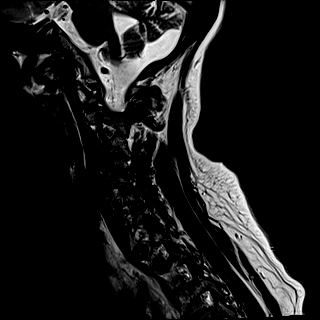
[im 15/15]
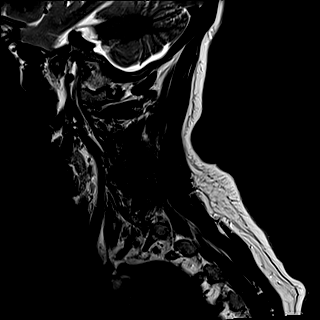

[Series 6: FLAIR · sagittal · 3.0mm · 0.78mm/px · 7 of 15 slices shown]
[im 1/15]
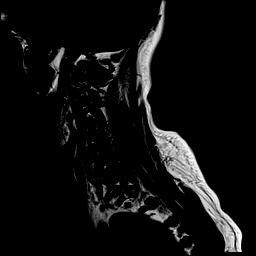
[im 3/15]
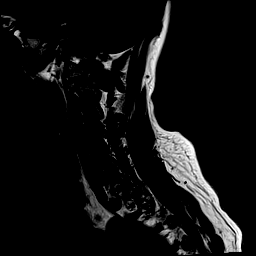
[im 5/15]
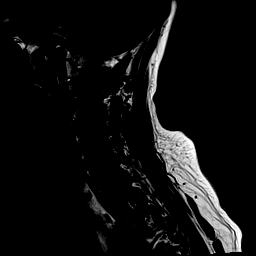
[im 8/15]
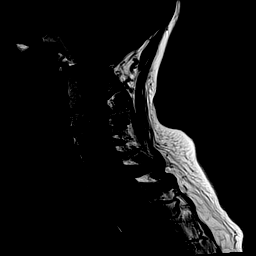
[im 10/15]
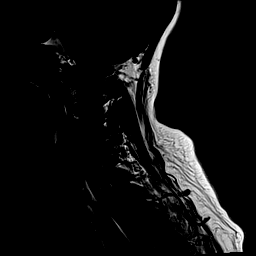
[im 12/15]
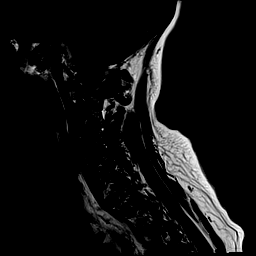
[im 15/15]
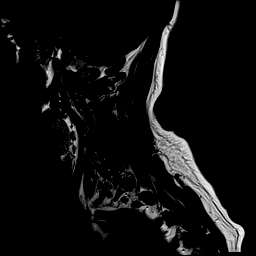

[Series 7: STIR · sagittal · 3.0mm · 0.62mm/px · 7 of 15 slices shown]
[im 1/15]
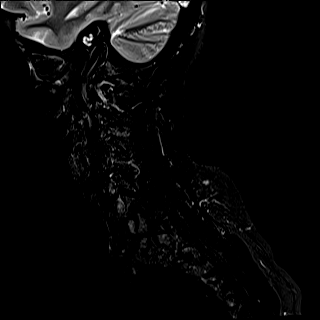
[im 3/15]
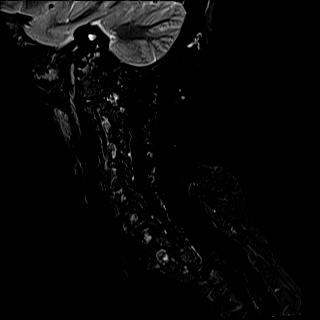
[im 5/15]
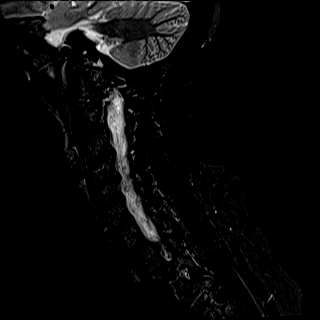
[im 8/15]
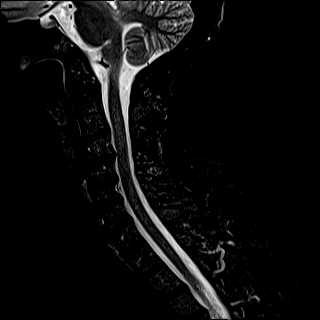
[im 10/15]
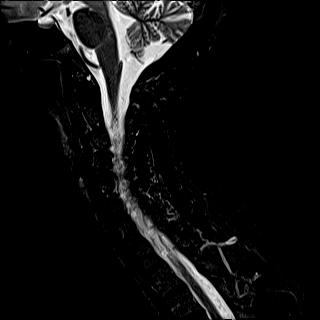
[im 12/15]
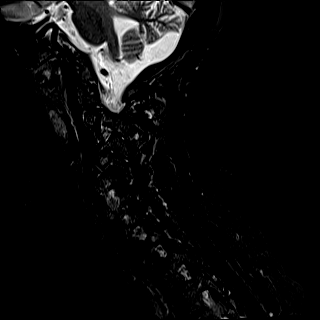
[im 15/15]
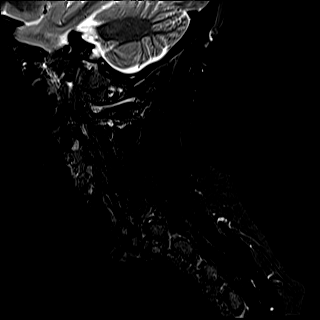

[Series 8: T2 · axial · 3.0mm · 0.70mm/px · z∈[-110,-18]mm · 9 of 29 slices shown (2 of 2)]
[im 1/29]
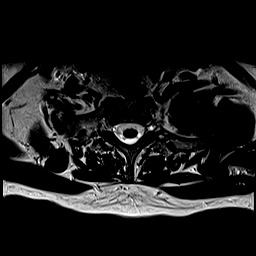
[im 3/29]
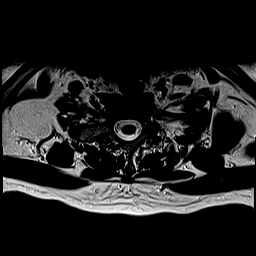
[im 5/29]
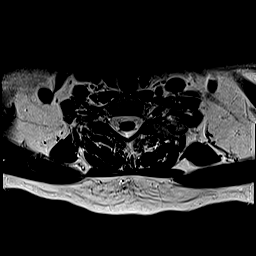
[im 9/29]
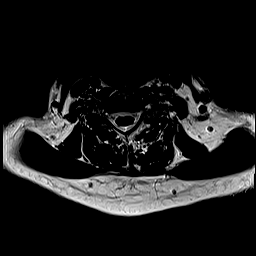
[im 13/29]
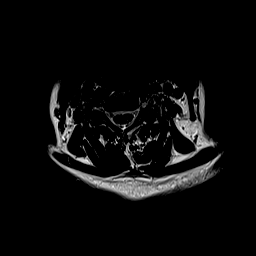
[im 16/29]
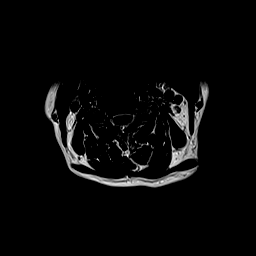
[im 20/29]
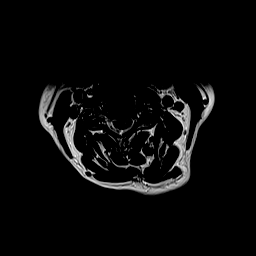
[im 24/29]
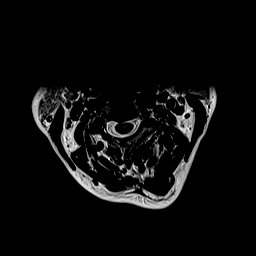
[im 29/29]
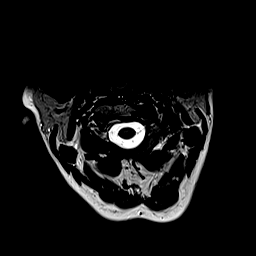

[Series 9: ax mpgr · axial · 3.0mm · 0.35mm/px · z∈[-110,-18]mm · 8 of 29 slices shown]
[im 1/29]
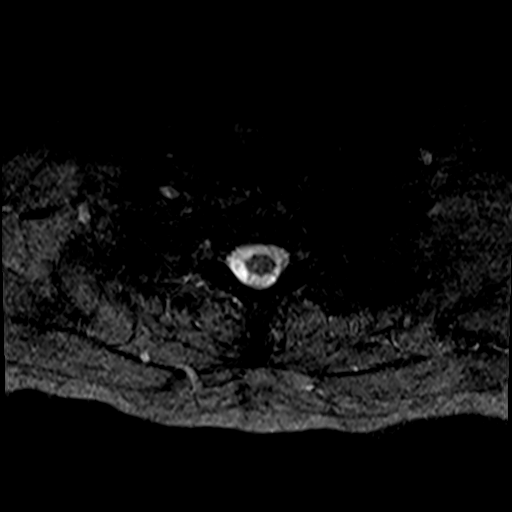
[im 5/29]
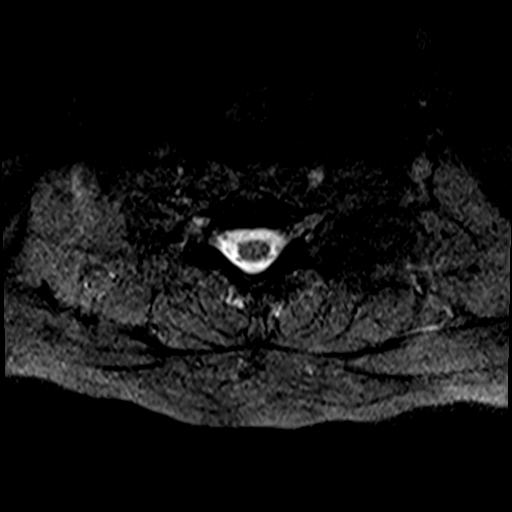
[im 9/29]
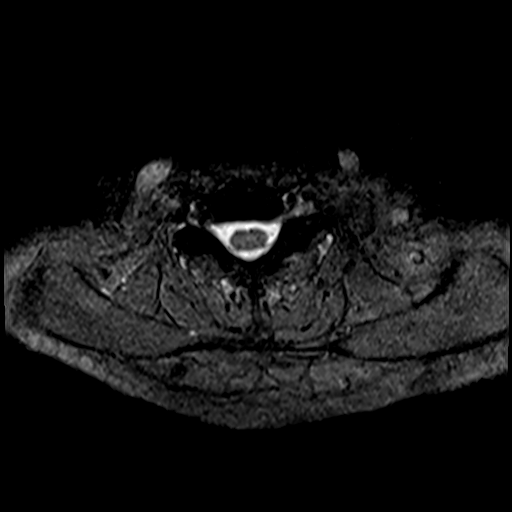
[im 13/29]
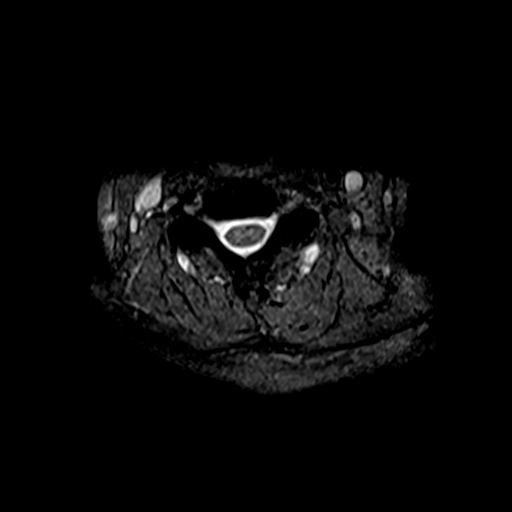
[im 16/29]
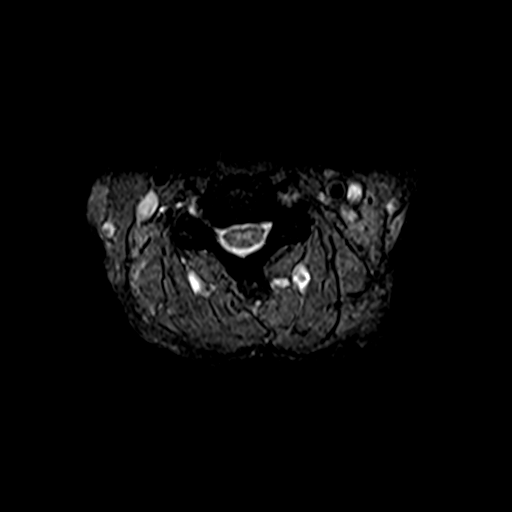
[im 20/29]
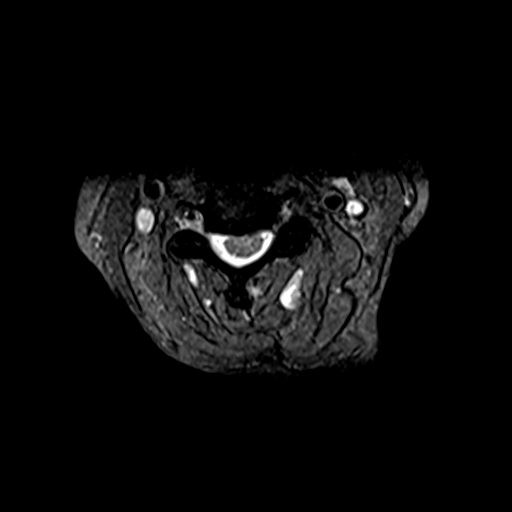
[im 24/29]
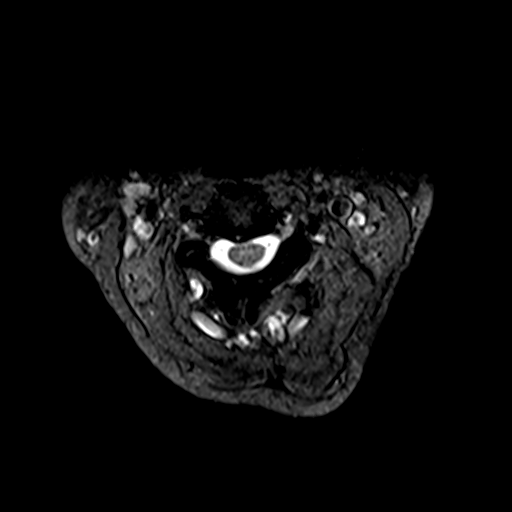
[im 29/29]
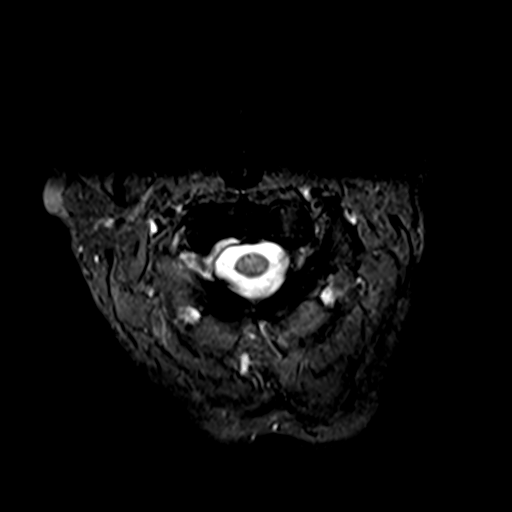

[37 of 48 positions shown; findings below may reference images not displayed]

FINDINGS: Alignment: Reversal of the normal upper cervical lordosis. No
listhesis.

Vertebrae: Vertebral body height maintained without acute or chronic
fracture. Bone marrow signal intensity within normal limits. No
discrete or worrisome osseous lesions. No abnormal marrow edema.

Cord: Normal signal and morphology.

Posterior Fossa, vertebral arteries, paraspinal tissues: Visualized
brain and posterior fossa within normal limits. Craniocervical
junction normal. Paraspinous and prevertebral soft tissues within
normal limits. Normal flow voids seen within the vertebral arteries
bilaterally.

Disc levels:

C2-C3: Shallow right paracentral disc protrusion with associated
annular fissure indents the right ventral thecal sac (series 9,
image 6). Mild flattening of the right ventral cord without
significant spinal stenosis. Foramina remain patent.

C3-C4: Mild diffuse disc bulge with uncovertebral hypertrophy.
Posterior disc osteophyte flattens and partially effaces the ventral
thecal sac without significant spinal stenosis or cord deformity.
Mild left C4 foraminal narrowing related to uncovertebral disease.
Right neural foramina remains patent.

C4-C5: Diffuse disc bulge with bilateral uncovertebral and facet
hypertrophy. Flattening of the ventral thecal sac without
significant spinal stenosis. Moderate bilateral C5 foraminal
narrowing.

C5-C6: Mild disc bulge with uncovertebral spurring. No spinal
stenosis. Mild right C6 foraminal narrowing. Left neural foramina
remains patent.

C6-C7: Mild disc bulge with uncovertebral hypertrophy. No spinal
stenosis. Foramina remain patent.

C7-T1:  Unremarkable.

Visualized upper thoracic spine demonstrates no significant finding.
IMPRESSION: 1. Shallow right paracentral disc protrusion at C2-3 with secondary
mild flattening of the right ventral cord. Finding could potentially
affect the ventral right C3 nerve root.
2. Disc bulging with uncovertebral hypertrophy at C4-5 with
resultant moderate bilateral C5 foraminal stenosis.
3. Mild right C6 foraminal narrowing related to disc bulging and
uncovertebral disease.

## 2022-08-08 ENCOUNTER — Other Ambulatory Visit: Payer: Self-pay

## 2022-09-15 ENCOUNTER — Other Ambulatory Visit: Payer: Self-pay

## 2022-10-26 ENCOUNTER — Other Ambulatory Visit: Payer: Self-pay

## 2022-11-29 ENCOUNTER — Other Ambulatory Visit: Payer: Self-pay

## 2022-11-29 MED ORDER — CHOLESTYRAMINE 4 G PO PACK
4.0000 g | PACK | Freq: Every morning | ORAL | 11 refills | Status: DC
  Filled 2022-11-29 – 2022-12-01 (×2): qty 30, 30d supply, fill #0

## 2022-12-01 ENCOUNTER — Other Ambulatory Visit: Payer: Self-pay

## 2023-03-09 ENCOUNTER — Encounter: Payer: Self-pay | Admitting: Obstetrics and Gynecology

## 2023-03-09 DIAGNOSIS — Z1231 Encounter for screening mammogram for malignant neoplasm of breast: Secondary | ICD-10-CM

## 2023-03-14 ENCOUNTER — Encounter: Payer: Self-pay | Admitting: Obstetrics and Gynecology

## 2023-03-15 ENCOUNTER — Other Ambulatory Visit: Payer: Self-pay | Admitting: Obstetrics and Gynecology

## 2023-03-15 DIAGNOSIS — N644 Mastodynia: Secondary | ICD-10-CM

## 2023-03-28 ENCOUNTER — Ambulatory Visit
Admission: RE | Admit: 2023-03-28 | Discharge: 2023-03-28 | Disposition: A | Discharge status: Home / Self Care | Payer: 59 | Source: Ambulatory Visit | Attending: Obstetrics and Gynecology | Admitting: Obstetrics and Gynecology

## 2023-03-28 DIAGNOSIS — N644 Mastodynia: Secondary | ICD-10-CM | POA: Diagnosis present

## 2023-03-30 ENCOUNTER — Other Ambulatory Visit: Payer: Self-pay | Admitting: Obstetrics and Gynecology

## 2023-03-30 DIAGNOSIS — N63 Unspecified lump in unspecified breast: Secondary | ICD-10-CM

## 2023-03-30 DIAGNOSIS — R928 Other abnormal and inconclusive findings on diagnostic imaging of breast: Secondary | ICD-10-CM

## 2023-04-05 ENCOUNTER — Ambulatory Visit
Admission: RE | Admit: 2023-04-05 | Discharge: 2023-04-05 | Disposition: A | Discharge status: Home / Self Care | Payer: 59 | Source: Ambulatory Visit | Attending: Obstetrics and Gynecology | Admitting: Obstetrics and Gynecology

## 2023-04-05 DIAGNOSIS — N63 Unspecified lump in unspecified breast: Secondary | ICD-10-CM | POA: Diagnosis present

## 2023-04-05 DIAGNOSIS — R928 Other abnormal and inconclusive findings on diagnostic imaging of breast: Secondary | ICD-10-CM | POA: Insufficient documentation

## 2023-04-05 HISTORY — PX: BREAST BIOPSY: SHX20

## 2023-04-05 MED ORDER — LIDOCAINE 1 % OPTIME INJ - NO CHARGE
5.0000 mL | Freq: Once | INTRAMUSCULAR | Status: AC
  Administered 2023-04-05: 5 mL
  Filled 2023-04-05: qty 6

## 2023-04-05 MED ORDER — LIDOCAINE-EPINEPHRINE 1 %-1:100000 IJ SOLN
8.0000 mL | Freq: Once | INTRAMUSCULAR | Status: AC
  Administered 2023-04-05: 8 mL
  Filled 2023-04-05: qty 8

## 2023-04-06 LAB — SURGICAL PATHOLOGY

## 2023-05-19 ENCOUNTER — Encounter: Payer: Self-pay | Admitting: Gastroenterology

## 2023-06-13 ENCOUNTER — Encounter (HOSPITAL_COMMUNITY): Payer: Self-pay

## 2023-06-20 ENCOUNTER — Ambulatory Visit
Admission: RE | Admit: 2023-06-20 | Discharge: 2023-06-20 | Disposition: A | Discharge status: Home / Self Care | Attending: Gastroenterology | Admitting: Gastroenterology

## 2023-06-20 ENCOUNTER — Encounter
Admission: RE | Disposition: A | Discharge status: Home / Self Care | Payer: Self-pay | Source: Home / Self Care | Attending: Gastroenterology

## 2023-06-20 ENCOUNTER — Ambulatory Visit: Admitting: Anesthesiology

## 2023-06-20 ENCOUNTER — Encounter: Payer: Self-pay | Admitting: Gastroenterology

## 2023-06-20 DIAGNOSIS — R197 Diarrhea, unspecified: Secondary | ICD-10-CM | POA: Insufficient documentation

## 2023-06-20 DIAGNOSIS — Z79899 Other long term (current) drug therapy: Secondary | ICD-10-CM | POA: Insufficient documentation

## 2023-06-20 DIAGNOSIS — K5900 Constipation, unspecified: Secondary | ICD-10-CM | POA: Insufficient documentation

## 2023-06-20 DIAGNOSIS — K219 Gastro-esophageal reflux disease without esophagitis: Secondary | ICD-10-CM | POA: Insufficient documentation

## 2023-06-20 DIAGNOSIS — K2970 Gastritis, unspecified, without bleeding: Secondary | ICD-10-CM | POA: Insufficient documentation

## 2023-06-20 DIAGNOSIS — D123 Benign neoplasm of transverse colon: Secondary | ICD-10-CM | POA: Insufficient documentation

## 2023-06-20 DIAGNOSIS — Z83719 Family history of colon polyps, unspecified: Secondary | ICD-10-CM | POA: Diagnosis not present

## 2023-06-20 DIAGNOSIS — K640 First degree hemorrhoids: Secondary | ICD-10-CM | POA: Insufficient documentation

## 2023-06-20 DIAGNOSIS — R1013 Epigastric pain: Secondary | ICD-10-CM | POA: Insufficient documentation

## 2023-06-20 HISTORY — PX: ESOPHAGOGASTRODUODENOSCOPY: SHX5428

## 2023-06-20 HISTORY — DX: Ankylosing spondylitis of unspecified sites in spine: M45.9

## 2023-06-20 HISTORY — PX: COLONOSCOPY: SHX5424

## 2023-06-20 HISTORY — DX: Primary generalized (osteo)arthritis: M15.0

## 2023-06-20 HISTORY — DX: Attention-deficit hyperactivity disorder, unspecified type: F90.9

## 2023-06-20 HISTORY — PX: POLYPECTOMY: SHX149

## 2023-06-20 HISTORY — DX: Other specified abnormal immunological findings in serum: R76.8

## 2023-06-20 HISTORY — DX: Other intervertebral disc degeneration, lumbar region without mention of lumbar back pain or lower extremity pain: M51.369

## 2023-06-20 HISTORY — DX: Peptic ulcer, site unspecified, unspecified as acute or chronic, without hemorrhage or perforation: K27.9

## 2023-06-20 HISTORY — DX: Epigastric pain: R10.13

## 2023-06-20 SURGERY — COLONOSCOPY
Anesthesia: General

## 2023-06-20 MED ORDER — LIDOCAINE HCL (PF) 2 % IJ SOLN
INTRAMUSCULAR | Status: AC
  Filled 2023-06-20: qty 5

## 2023-06-20 MED ORDER — PROPOFOL 10 MG/ML IV BOLUS
INTRAVENOUS | Status: DC | PRN
  Administered 2023-06-20: 100 mg via INTRAVENOUS

## 2023-06-20 MED ORDER — PROPOFOL 500 MG/50ML IV EMUL
INTRAVENOUS | Status: DC | PRN
  Administered 2023-06-20: 150 ug/kg/min via INTRAVENOUS

## 2023-06-20 MED ORDER — LIDOCAINE HCL (CARDIAC) PF 100 MG/5ML IV SOSY
PREFILLED_SYRINGE | INTRAVENOUS | Status: DC | PRN
  Administered 2023-06-20: 60 mg via INTRAVENOUS

## 2023-06-20 MED ORDER — SODIUM CHLORIDE 0.9 % IV SOLN
INTRAVENOUS | Status: DC
  Administered 2023-06-20: 20 mL/h via INTRAVENOUS

## 2023-06-20 MED ORDER — PROPOFOL 1000 MG/100ML IV EMUL
INTRAVENOUS | Status: AC
  Filled 2023-06-20: qty 100

## 2023-06-20 NOTE — H&P (Signed)
 Pre-Procedure H&P   Patient ID: Paige Robles is a 58 y.o. female.  Gastroenterology Provider: Quintin Buckle, DO  Referring Provider: Laquetta Plank, PA PCP: Melchor Spoon, MD  Date: 06/20/2023  HPI Paige Robles is a 58 y.o. female who presents today for Esophagogastroduodenoscopy and Colonoscopy for GERD, epigastric pain, family history of colon polyps .  Patient's father with colon polyps in his 17s.  She has undergone colonoscopy and not had colon polyps personally.  Colonoscopy October 2015 and October 2019  She has had variable bowel movements alternating between diarrhea and constipation.  She will occasionally have urgency and some fecal leakage.  Notes reflux symptoms without dysphagia or odynophagia.  She has undergone previous endoscopy which has ultimately been unrevealing.  October 2000 16 December 2016  Hemoglobin 12.1 MCV 88 platelets 353,000 creatinine 0.7   Past Medical History:  Diagnosis Date   ANA positive    Anemia    H/O   Ankylosing spondylitis (HCC)    Attention deficit hyperactivity disorder    Complication of anesthesia    HARD TO WAKE UP   DDD (degenerative disc disease), lumbar    Difficult intubation 1992   FOR C-SECTION- ASPIRATED DURING SECTION    Diverticulosis    Dyspepsia    Dysplastic nevus 12/31/2015   Right low back paraspinal 1.5cm lat to spine. Mild atypia, lateral margin involved.   Dysplastic nevus 12/31/2015   Left UQA periumbilical. Mild atypia, margins free.   Dysplastic nevus 03/12/2019   Right lateral waistline. Mild atypia, limited margins free.   Endometriosis    Endometriosis    GERD (gastroesophageal reflux disease)    Pneumonia    AFTER -C-SECTION   Primary osteoarthritis involving multiple joints    PUD (peptic ulcer disease)     Past Surgical History:  Procedure Laterality Date   ABDOMINAL HYSTERECTOMY     BACK SURGERY  2009   BREAST BIOPSY Right    US  BX right breast 9:30 RIBBON  CLIP-path pending   BREAST BIOPSY Right 04/05/2023   US  RT BREAST BX W LOC DEV 1ST LESION IMG BX SPEC US  GUIDE 04/05/2023 [facility]   CESAREAN SECTION  16,[id number],9604   CHOLECYSTECTOMY  1999   COLONOSCOPY     DILATION AND CURETTAGE OF UTERUS  [phone]   LAPAROSCOPIC BILATERAL SALPINGO OOPHERECTOMY N/A 01/19/2018   Procedure: LAPAROSCOPIC BILATERAL SALPINGO OOPHORECTOMY;  Surgeon: Alben Alma, MD;  Location: [facility];  Service: Gynecology;  Laterality: N/A;   LAPAROSCOPIC LYSIS OF ADHESIONS  01/19/2018   Procedure: LAPAROSCOPIC LYSIS OF ADHESIONS;  Surgeon: Alben Alma, MD;  Location: [facility];  Service: Gynecology;;   LAPAROSCOPIC SUPRACERVICAL HYSTERECTOMY  2010   MICRODISCECTOMY LUMBAR      Family History Father- colon polyps 71s No other h/o GI disease or malignancy  Review of Systems  Constitutional:  Negative for activity change, appetite change, chills, diaphoresis, fatigue, fever and unexpected weight change.  HENT:  Negative for trouble swallowing and voice change.   Respiratory:  Negative for shortness of breath and wheezing.   Cardiovascular:  Negative for chest pain, palpitations and leg swelling.  Gastrointestinal:  Positive for abdominal pain, constipation and diarrhea. Negative for abdominal distention, anal bleeding, blood in stool, nausea, rectal pain and vomiting.  Musculoskeletal:  Negative for arthralgias and myalgias.  Skin:  Negative for color change and pallor.  Neurological:  Negative for dizziness, syncope and weakness.  Psychiatric/Behavioral:  Negative for confusion.   All other systems  reviewed and are negative.    Medications No current facility-administered medications on file prior to encounter.   Current Outpatient Medications on File Prior to Encounter  Medication Sig Dispense Refill   Adalimumab  (HUMIRA  PEN) 40 MG/0.8ML PNKT INJECT 40MG  SUBCUTANEOUSLY EVERY 2 WEEKS AS DIRECTED 2 each 12   Armodafinil  50 MG tablet Take by mouth.      azelastine  (ASTELIN ) 0.1 % nasal spray Place 1 spray into both nostrils 2 (two) times daily as needed for rhinitis.      benzonatate  (TESSALON ) 200 MG capsule Take 1 capsule (200 mg total) by mouth 3 (three) times daily as needed for Cough for up to 7 days 20 capsule 0   DULoxetine  (CYMBALTA ) 60 MG capsule Take 60 mg by mouth every morning.      esomeprazole  (NEXIUM ) 40 MG capsule Take 40 mg by mouth 2 (two) times daily.      fexofenadine  (ALLEGRA ) 180 MG tablet Take 180 mg by mouth every morning.      gabapentin  (NEURONTIN ) 100 MG capsule Take 1 capsule by mouth at bedtime for 4 days, then 1 capsule twice a day x 4 days, then 1 capsule 3 times a day thereafter. 90 capsule 3   Imiquimod  3.75 % CREA Apply 1 application. topically daily. To wart As needed 28 g 0   LORazepam  (ATIVAN ) 0.5 MG tablet Take 1 mg by mouth at bedtime.      metaxalone  (SKELAXIN ) 800 MG tablet Take 800 mg by mouth at bedtime.      mometasone  (NASONEX ) 50 MCG/ACT nasal spray Place 2 sprays into the nose daily as needed (for congestion).      nirmatrelvir  & ritonavir  (PAXLOVID , 300/100,) 20 x 150 MG & 10 x 100MG  TBPK Take 2 (two) pink tablets (300 mg nirmatrelvir ) with 1 (one) tablet (100 mg ritonavir ) by mouth twice a day for 5 days. All 3 (three) tablets to be taken together every morning and evening. 30 each 0   ondansetron  (ZOFRAN -ODT) 4 MG disintegrating tablet Take 4 mg by mouth every 8 (eight) hours as needed.     ondansetron  (ZOFRAN -ODT) 4 MG disintegrating tablet Take 1 tablet (4 mg total) by mouth every 8 (eight) hours as needed for Nausea 20 tablet 5   prednisoLONE  acetate (PRED FORTE ) 1 % ophthalmic suspension Apply 1 drop into both eyes every three hours while awake 15 mL 1   Probiotic Product (PROBIOTIC PO) Take 1 capsule by mouth daily.     Sinecatechins  (VEREGEN ) 15 % OINT Apply 1 application. topically at bedtime. Qhs and cover to wart on finger 30 g 3   azelastine  (ASTELIN ) 0.1 % nasal spray PLACE 1 SPRAY INTO  BOTH NOSTRILS 2 TIMES DAILY AS NEEDED 90 mL 4   COVID-19 mRNA vaccine, Moderna, (MODERNA COVID-19 VACCINE ) 100 MCG/0.5ML injection Inject into the muscle. 0.25 mL 0   DULoxetine  (CYMBALTA ) 60 MG capsule TAKE 1 CAPSULE BY MOUTH ONCE DAILY. 90 capsule 3   esomeprazole  (NEXIUM ) 40 MG capsule TAKE 1 CAPSULE BY MOUTH TWICE A DAY 180 capsule 1   esomeprazole  (NEXIUM ) 40 MG capsule TAKE 1 CAPSULE BY MOUTH TWICE A DAY 180 capsule 1   fexofenadine  (ALLEGRA ) 180 MG tablet TAKE 1 TABLET BY MOUTH ONCE DAILY. 100 tablet 4   gabapentin  (NEURONTIN ) 100 MG capsule TAKE 1 CAPSULE BY MOUTH AT BEDTIME FOR 4 DAYS, THEN 1 CAPSULE 2 TIMES DAILY FOR 4 DAYS, THEN 1 CAPSULE 3 TIMES DAILY THEREAFTER. 90 capsule 3   gabapentin  (NEURONTIN ) 100 MG  capsule TAKE 1 CAPSULE (100 MG TOTAL) BY MOUTH 2 (TWO) TIMES DAILY 180 capsule 3   gabapentin  (NEURONTIN ) 100 MG capsule TAKE 1 CAPSULE BY MOUTH TWICE DAILY 60 capsule 2   hydrochlorothiazide  (HYDRODIURIL ) 25 MG tablet Take 12.5 mg by mouth daily.      hydrochlorothiazide  (HYDRODIURIL ) 25 MG tablet TAKE 1 TABLET BY MOUTH ONCE DAILY 90 tablet 3   mometasone  (NASONEX ) 50 MCG/ACT nasal spray PLACE 2 SPRAYS INTO BOTH NOSTRILS ONCE DAILY 51 g 4   [DISCONTINUED] cholestyramine  (QUESTRAN ) 4 g packet Take 1 packet (4 g total) by mouth every morning before breakfast Mix dose in 60-180 mL of water, milk or juice. 30 packet 11    Pertinent medications related to GI and procedure were reviewed by me with the patient prior to the procedure   Current Facility-Administered Medications:    0.9 %  sodium chloride  infusion, , Intravenous, Continuous, Quintin Buckle, DO, Last Rate: 20 mL/hr at 06/20/23 1033, Continued from Pre-op at 06/20/23 1033  sodium chloride  20 mL/hr at 06/20/23 1033       Allergies  Allergen Reactions   Adalimumab  Other (See Comments)    DID NOT TOLERATE WELL   Certolizumab Pegol Other (See Comments)    EYE INFLAMMATION   Norflex Tablets [Orphenadrine]     Orphenadrine Citrate Other (See Comments)    Seizure, STOPPED BREATHING   Sulfamethoxazole -Trimethoprim Rash   Allergies were reviewed by me prior to the procedure  Objective   Body mass index is 32.42 kg/m. Vitals:   06/20/23 1019  BP: (!) 143/99  Pulse: (!) 108  Resp: 20  Temp: (!) 96 F (35.6 C)  TempSrc: Temporal  SpO2: 98%  Weight: 75.3 kg  Height: 5' (1.524 m)     Physical Exam Vitals and nursing note reviewed.  Constitutional:      General: She is not in acute distress.    Appearance: Normal appearance. She is obese. She is not ill-appearing, toxic-appearing or diaphoretic.  HENT:     Head: Normocephalic and atraumatic.     Nose: Nose normal.     Mouth/Throat:     Mouth: Mucous membranes are moist.     Pharynx: Oropharynx is clear.  Eyes:     General: No scleral icterus.    Extraocular Movements: Extraocular movements intact.  Cardiovascular:     Rate and Rhythm: Regular rhythm. Tachycardia present.     Heart sounds: Normal heart sounds. No murmur heard.    No friction rub. No gallop.  Pulmonary:     Effort: Pulmonary effort is normal. No respiratory distress.     Breath sounds: Normal breath sounds. No wheezing, rhonchi or rales.  Abdominal:     General: Bowel sounds are normal. There is no distension.     Palpations: Abdomen is soft.     Tenderness: There is no abdominal tenderness. There is no guarding or rebound.  Musculoskeletal:     Cervical back: Neck supple.     Right lower leg: Edema present.     Left lower leg: Edema present.  Skin:    General: Skin is warm and dry.     Coloration: Skin is not jaundiced or pale.  Neurological:     General: No focal deficit present.     Mental Status: She is alert and oriented to person, place, and time. Mental status is at baseline.  Psychiatric:        Mood and Affect: Mood normal.  Behavior: Behavior normal.        Thought Content: Thought content normal.        Judgment: Judgment normal.       Assessment:  Ms. Razia Screws is a 58 y.o. female  who presents today for Esophagogastroduodenoscopy and Colonoscopy for GERD, epigastric pain, family history of colon polyps .  Plan:  Esophagogastroduodenoscopy and Colonoscopy with possible intervention today  Esophagogastroduodenoscopy and Colonoscopy with possible biopsy, control of bleeding, polypectomy, and interventions as necessary has been discussed with the patient/patient representative. Informed consent was obtained from the patient/patient representative after explaining the indication, nature, and risks of the procedure including but not limited to death, bleeding, perforation, missed neoplasm/lesions, cardiorespiratory compromise, and reaction to medications. Opportunity for questions was given and appropriate answers were provided. Patient/patient representative has verbalized understanding is amenable to undergoing the procedure.   Quintin Buckle, DO  [facility]  Portions of the record may have been created with voice recognition software. Occasional wrong-word or 'sound-a-like' substitutions may have occurred due to the inherent limitations of voice recognition software.  Read the chart carefully and recognize, using context, where substitutions may have occurred.

## 2023-06-20 NOTE — Transfer of Care (Signed)
 Immediate Anesthesia Transfer of Care Note  Patient: Day Greb  Procedure(s) Performed: COLONOSCOPY EGD (ESOPHAGOGASTRODUODENOSCOPY) POLYPECTOMY, INTESTINE  Patient Location: PACU  Anesthesia Type:General  Level of Consciousness: awake and sedated  Airway & Oxygen Therapy: Patient Spontanous Breathing and Patient connected to nasal cannula oxygen  Post-op Assessment: Report given to RN and Post -op Vital signs reviewed and stable  Post vital signs: Reviewed and stable  Last Vitals:  Vitals Value Taken Time  BP    Temp    Pulse    Resp    SpO2      Last Pain:  Vitals:   06/20/23 1019  TempSrc: Temporal  PainSc: 6          Complications: There were no known notable events for this encounter.

## 2023-06-20 NOTE — Interval H&P Note (Signed)
 History and Physical Interval Note: Preprocedure H&P from 06/20/23  was reviewed and there was no interval change after seeing and examining the patient.  Written consent was obtained from the patient after discussion of risks, benefits, and alternatives. Patient has consented to proceed with Esophagogastroduodenoscopy and Colonoscopy with possible intervention   06/20/2023 11:24 AM  Paige Robles  has presented today for surgery, with the diagnosis of Z83.719 (ICD-10-CM) - Family history of colonic polyps K21.9 (ICD-10-CM) - Gastroesophageal reflux disease, unspecified whether esophagitis present R10.13 (ICD-10-CM) - Epigastric pain.  The various methods of treatment have been discussed with the patient and family. After consideration of risks, benefits and other options for treatment, the patient has consented to  Procedure(s): COLONOSCOPY (N/A) EGD (ESOPHAGOGASTRODUODENOSCOPY) (N/A) as a surgical intervention.  The patient's history has been reviewed, patient examined, no change in status, stable for surgery.  I have reviewed the patient's chart and labs.  Questions were answered to the patient's satisfaction.     Paige Robles

## 2023-06-20 NOTE — Care Plan (Signed)
 Brief GI Post op note  Patient c/o lip pain post procedure She was evaluated and no signs of cuts/bleeding. Suspect 2/2 plastic mouth guard, however, no irritation/injury appreciated. Recommended pt trial over the counter numbing medication such as oragel if needed. Pt verbalized understanding. No further questions Pt's husband was informed of procedures/exam and findings as well  Jorje Newton, DO [facility]

## 2023-06-20 NOTE — Anesthesia Preprocedure Evaluation (Signed)
 Anesthesia Evaluation  Patient identified by MRN, date of birth, ID band Patient awake    Reviewed: Allergy & Precautions, H&P , NPO status , Patient's Chart, lab work & pertinent test results, reviewed documented beta blocker date and time   History of Anesthesia Complications (+) DIFFICULT AIRWAY and history of anesthetic complications  Airway Mallampati: II   Neck ROM: full    Dental  (+) Poor Dentition   Pulmonary pneumonia, resolved   Pulmonary exam normal        Cardiovascular Exercise Tolerance: Good negative cardio ROS Normal cardiovascular exam Rhythm:regular Rate:Normal     Neuro/Psych  PSYCHIATRIC DISORDERS      negative neurological ROS     GI/Hepatic Neg liver ROS, PUD,GERD  Medicated,,  Endo/Other  negative endocrine ROS    Renal/GU negative Renal ROS  negative genitourinary   Musculoskeletal   Abdominal   Peds  Hematology  (+) Blood dyscrasia, anemia   Anesthesia Other Findings Past Medical History: No date: ANA positive No date: Anemia     Comment:  H/O No date: Ankylosing spondylitis (HCC) No date: Attention deficit hyperactivity disorder No date: Complication of anesthesia     Comment:  HARD TO WAKE UP No date: DDD (degenerative disc disease), lumbar 1992: Difficult intubation     Comment:  FOR C-SECTION- ASPIRATED DURING SECTION  No date: Diverticulosis No date: Dyspepsia 12/31/2015: Dysplastic nevus     Comment:  Right low back paraspinal 1.5cm lat to spine. Mild               atypia, lateral margin involved. 12/31/2015: Dysplastic nevus     Comment:  Left UQA periumbilical. Mild atypia, margins free. 03/12/2019: Dysplastic nevus     Comment:  Right lateral waistline. Mild atypia, limited margins               free. No date: Endometriosis No date: Endometriosis No date: GERD (gastroesophageal reflux disease) No date: Pneumonia     Comment:  AFTER -C-SECTION No date: Primary  osteoarthritis involving multiple joints No date: PUD (peptic ulcer disease) Past Surgical History: No date: ABDOMINAL HYSTERECTOMY 2009: BACK SURGERY No date: BREAST BIOPSY; Right     Comment:  US  BX right breast 9:30 RIBBON CLIP-path pending 04/05/2023: BREAST BIOPSY; Right     Comment:  US  RT BREAST BX W LOC DEV 1ST LESION IMG BX SPEC US                GUIDE 04/05/2023 [facility] 40,98,1191: CESAREAN SECTION 1999: CHOLECYSTECTOMY No date: COLONOSCOPY [phone]: DILATION AND CURETTAGE OF UTERUS 01/19/2018: LAPAROSCOPIC BILATERAL SALPINGO OOPHERECTOMY; N/A     Comment:  Procedure: LAPAROSCOPIC BILATERAL SALPINGO OOPHORECTOMY;              Surgeon: Alben Alma, MD;  Location: [facility];                Service: Gynecology;  Laterality: N/A; 01/19/2018: LAPAROSCOPIC LYSIS OF ADHESIONS     Comment:  Procedure: LAPAROSCOPIC LYSIS OF ADHESIONS;  Surgeon:               Alben Alma, MD;  Location: [facility];  Service:               Gynecology;; 2010: LAPAROSCOPIC SUPRACERVICAL HYSTERECTOMY No date: MICRODISCECTOMY LUMBAR BMI    Body Mass Index: 32.42 kg/m     Reproductive/Obstetrics negative OB ROS  Anesthesia Physical Anesthesia Plan  ASA: 3  Anesthesia Plan: General   Post-op Pain Management:    Induction:   PONV Risk Score and Plan:   Airway Management Planned:   Additional Equipment:   Intra-op Plan:   Post-operative Plan:   Informed Consent: I have reviewed the patients History and Physical, chart, labs and discussed the procedure including the risks, benefits and alternatives for the proposed anesthesia with the patient or authorized representative who has indicated his/her understanding and acceptance.     Dental Advisory Given  Plan Discussed with: CRNA  Anesthesia Plan Comments:        Anesthesia Quick Evaluation

## 2023-06-20 NOTE — Op Note (Signed)
 [facility] Patient Name: Paige Robles Procedure Date: 06/20/2023 11:17 AM MRN: [id number] Account #: [id number] Date of Birth: [birth year] Admit Type: Outpatient Age: 58 Room: [facility] ENDO ROOM 2 Gender: Female Note Status: Finalized Instrument Name: [employer] Main [id number] Procedure:             Colonoscopy Indications:           Diarrhea, Constipation, Family history of colon polyps Providers:             Bridgett Camps, DO Referring MD:          Eddy Goodell, MD (Referring MD) Medicines:             Monitored Anesthesia Care Complications:         No immediate complications. Estimated blood loss:                         Minimal. Procedure:             Pre-Anesthesia Assessment:                        - Prior to the procedure, a History and Physical was                         performed, and patient medications and allergies were                         reviewed. The patient is competent. The risks and                         benefits of the procedure and the sedation options and                         risks were discussed with the patient. All questions                         were answered and informed consent was obtained.                         Patient identification and proposed procedure were                         verified by the physician, the nurse, the anesthetist                         and the technician in the endoscopy suite. Mental                         Status Examination: alert and oriented. Airway                         Examination: normal oropharyngeal airway and neck                         mobility. Respiratory Examination: clear to                         auscultation. CV Examination: RRR, no murmurs, no S3  or S4. Prophylactic Antibiotics: The patient does not                         require prophylactic antibiotics. Prior                         Anticoagulants: The patient has taken no  anticoagulant                         or antiplatelet agents. ASA Grade Assessment: III - A                         patient with severe systemic disease. After reviewing                         the risks and benefits, the patient was deemed in                         satisfactory condition to undergo the procedure. The                         anesthesia plan was to use monitored anesthesia care                         (MAC). Immediately prior to administration of                         medications, the patient was re-assessed for adequacy                         to receive sedatives. The heart rate, respiratory                         rate, oxygen saturations, blood pressure, adequacy of                         pulmonary ventilation, and response to care were                         monitored throughout the procedure. The physical                         status of the patient was re-assessed after the                         procedure.                        After obtaining informed consent, the colonoscope was                         passed under direct vision. Throughout the procedure,                         the patient's blood pressure, pulse, and oxygen                         saturations were monitored continuously. The  Colonoscope was introduced through the anus and                         advanced to the the cecum, identified by appendiceal                         orifice and ileocecal valve. The colonoscopy was                         performed without difficulty. The patient tolerated                         the procedure well. The quality of the bowel                         preparation was evaluated using the BBPS [facility] Bowel                         Preparation Scale) with scores of: Right Colon = 3,                         Transverse Colon = 3 and Left Colon = 3 (entire mucosa                         seen well with no residual staining, small  fragments                         of stool or opaque liquid). The total BBPS score                         equals 9. The ileocecal valve, appendiceal orifice,                         and rectum were photographed. Findings:      The perianal and digital rectal examinations were normal. Pertinent       negatives include normal sphincter tone.      A 1 to 2 mm polyp was found in the transverse colon. The polyp was       sessile. The polyp was removed with a jumbo cold forceps. Resection and       retrieval were complete. Estimated blood loss was minimal.      Non-bleeding internal hemorrhoids were found during retroflexion. The       hemorrhoids were Grade I (internal hemorrhoids that do not prolapse).       Estimated blood loss: none.      Normal mucosa was found in the entire colon. Biopsies for histology were       taken with a cold forceps from the right colon and left colon for       evaluation of microscopic colitis. Estimated blood loss was minimal.      The exam was otherwise without abnormality on direct and retroflexion       views. Impression:            - One 1 to 2 mm polyp in the transverse colon, removed                         with a jumbo cold forceps.  Resected and retrieved.                        - Non-bleeding internal hemorrhoids.                        - Normal mucosa in the entire examined colon. Biopsied.                        - The examination was otherwise normal on direct and                         retroflexion views. Recommendation:        - Patient has a contact number available for                         emergencies. The signs and symptoms of potential                         delayed complications were discussed with the patient.                         Return to normal activities tomorrow. Written                         discharge instructions were provided to the patient.                        - Discharge patient to home.                        - Resume  previous diet.                        - Continue present medications.                        - Await pathology results.                        - Repeat colonoscopy in 5 years for surveillance based                         on pathology results.                        - Return to GI office as previously scheduled.                        - The findings and recommendations were discussed with                         the patient. Procedure Code(s):     --- Professional ---                        [phone], Colonoscopy, flexible; with biopsy, single or                         multiple Diagnosis Code(s):     --- Professional ---  D12.3, Benign neoplasm of transverse colon (hepatic                         flexure or splenic flexure)                        K64.0, First degree hemorrhoids                        R19.7, Diarrhea, unspecified                        K59.00, Constipation, unspecified CPT copyright 2022 American [employer]. All rights reserved. The codes documented in this report are preliminary and upon coder review may  be revised to meet current compliance requirements. Attending Participation:      I personally performed the entire procedure. Polo Brisk, DO Quintin Buckle DO, DO 06/20/2023 12:02:51 PM This report has been signed electronically. Number of Addenda: 0 Note Initiated On: 06/20/2023 11:17 AM Scope Withdrawal Time: 0 hours 10 minutes 18 seconds  Total Procedure Duration: 0 hours 14 minutes 3 seconds  Estimated Blood Loss:  Estimated blood loss was minimal.      [facility]

## 2023-06-20 NOTE — Op Note (Signed)
 [facility] Patient Name: Paige Robles Procedure Date: 06/20/2023 11:19 AM MRN: [id number] Account #: [id number] Date of Birth: Jan 29, 1966 Admit Type: Outpatient Age: 58 Room: [facility] ENDO ROOM 2 Gender: Female Note Status: Finalized Instrument Name: Upper Endoscope [id number] Procedure:             Upper GI endoscopy Indications:           Dyspepsia, Suspected gastro-esophageal reflux disease Providers:             Bridgett Camps, DO Referring MD:          Eddy Goodell, MD (Referring MD) Medicines:             Monitored Anesthesia Care Complications:         No immediate complications. Estimated blood loss:                         Minimal. Procedure:             Pre-Anesthesia Assessment:                        - Prior to the procedure, a History and Physical was                         performed, and patient medications and allergies were                         reviewed. The patient is competent. The risks and                         benefits of the procedure and the sedation options and                         risks were discussed with the patient. All questions                         were answered and informed consent was obtained.                         Patient identification and proposed procedure were                         verified by the physician, the nurse, the anesthetist                         and the technician in the endoscopy suite. Mental                         Status Examination: alert and oriented. Airway                         Examination: normal oropharyngeal airway and neck                         mobility. Respiratory Examination: clear to                         auscultation. CV Examination: tachycardia noted.  Prophylactic Antibiotics: The patient does not require                         prophylactic antibiotics. Prior Anticoagulants: The                         patient has taken no  anticoagulant or antiplatelet                         agents. ASA Grade Assessment: III - A patient with                         severe systemic disease. After reviewing the risks and                         benefits, the patient was deemed in satisfactory                         condition to undergo the procedure. The anesthesia                         plan was to use monitored anesthesia care (MAC).                         Immediately prior to administration of medications,                         the patient was re-assessed for adequacy to receive                         sedatives. The heart rate, respiratory rate, oxygen                         saturations, blood pressure, adequacy of pulmonary                         ventilation, and response to care were monitored                         throughout the procedure. The physical status of the                         patient was re-assessed after the procedure.                        After obtaining informed consent, the endoscope was                         passed under direct vision. Throughout the procedure,                         the patient's blood pressure, pulse, and oxygen                         saturations were monitored continuously. The Endoscope                         was introduced through the mouth, and advanced to the  third part of duodenum. The upper GI endoscopy was                         accomplished without difficulty. The patient tolerated                         the procedure well. Findings:      The duodenal bulb, first portion of the duodenum, second portion of the       duodenum and third portion of the duodenum were normal. Biopsies for       histology were taken with a cold forceps for evaluation of celiac       disease. Estimated blood loss was minimal.      Multiple less than 5 mm sessile polyps with no bleeding and no stigmata       of recent bleeding were found in the gastric  body. consistent with       fundic gland polyps. Estimated blood loss: none.      Localized mild inflammation characterized by erythema was found in the       gastric antrum. Biopsies were taken with a cold forceps for Helicobacter       pylori testing. Estimated blood loss was minimal.      The Z-line was regular. Estimated blood loss: none.      Esophagogastric landmarks were identified: the gastroesophageal junction       was found at 35 cm from the incisors.      The examined esophagus was normal. Estimated blood loss: none. Impression:            - Normal duodenal bulb, first portion of the duodenum,                         second portion of the duodenum and third portion of                         the duodenum. Biopsied.                        - Multiple gastric polyps.                        - Gastritis. Biopsied.                        - Z-line regular.                        - Esophagogastric landmarks identified.                        - Normal esophagus. Recommendation:        - Patient has a contact number available for                         emergencies. The signs and symptoms of potential                         delayed complications were discussed with the patient.                         Return to normal activities tomorrow. Written  discharge instructions were provided to the patient.                        - Discharge patient to home.                        - Resume previous diet.                        - Continue present medications.                        - Await pathology results.                        - Return to GI clinic as previously scheduled.                        - proceed with colonoscopy. see report for further                         recommendations.                        - The findings and recommendations were discussed with                         the patient. Procedure Code(s):     --- Professional ---                         [phone], Esophagogastroduodenoscopy, flexible,                         transoral; with biopsy, single or multiple Diagnosis Code(s):     --- Professional ---                        K31.7, Polyp of stomach and duodenum                        K29.70, Gastritis, unspecified, without bleeding                        R10.13, Epigastric pain CPT copyright 2022 American Medical Association. All rights reserved. The codes documented in this report are preliminary and upon coder review may  be revised to meet current compliance requirements. Attending Participation:      I personally performed the entire procedure. Polo Brisk, DO Quintin Buckle DO, DO 06/20/2023 11:41:41 AM This report has been signed electronically. Number of Addenda: 0 Note Initiated On: 06/20/2023 11:19 AM Estimated Blood Loss:  Estimated blood loss was minimal.      [facility]

## 2023-06-21 ENCOUNTER — Encounter: Payer: Self-pay | Admitting: Gastroenterology

## 2023-06-21 LAB — SURGICAL PATHOLOGY

## 2023-06-22 NOTE — Anesthesia Postprocedure Evaluation (Signed)
 Anesthesia Post Note  Patient: Paige Robles  Procedure(s) Performed: COLONOSCOPY EGD (ESOPHAGOGASTRODUODENOSCOPY) POLYPECTOMY, INTESTINE  Patient location during evaluation: PACU Anesthesia Type: General Level of consciousness: awake and alert Pain management: pain level controlled Vital Signs Assessment: post-procedure vital signs reviewed and stable Respiratory status: spontaneous breathing, nonlabored ventilation, respiratory function stable and patient connected to nasal cannula oxygen Cardiovascular status: blood pressure returned to baseline and stable Postop Assessment: no apparent nausea or vomiting Anesthetic complications: no   There were no known notable events for this encounter.   Last Vitals:  Vitals:   06/20/23 1214 06/20/23 1223  BP: (!) 147/87 (!) 145/88  Pulse: 86 81  Resp: 14 12  Temp:    SpO2: 100% 100%    Last Pain:  Vitals:   06/20/23 1223  TempSrc:   PainSc: 0-No pain                 Zula Hitch

## 2023-07-05 ENCOUNTER — Ambulatory Visit
Admission: RE | Admit: 2023-07-05 | Discharge: 2023-07-05 | Disposition: A | Discharge status: Home / Self Care | Source: Ambulatory Visit | Attending: Gastroenterology | Admitting: Gastroenterology

## 2023-07-05 ENCOUNTER — Other Ambulatory Visit: Payer: Self-pay | Admitting: Gastroenterology

## 2023-07-05 DIAGNOSIS — R197 Diarrhea, unspecified: Secondary | ICD-10-CM | POA: Diagnosis present

## 2023-07-05 DIAGNOSIS — R1031 Right lower quadrant pain: Secondary | ICD-10-CM

## 2023-07-05 MED ORDER — IOHEXOL 300 MG/ML  SOLN
100.0000 mL | Freq: Once | INTRAMUSCULAR | Status: AC | PRN
Start: 2023-07-05 — End: 2023-07-05
  Administered 2023-07-05: 100 mL via INTRAVENOUS
